# Patient Record
Sex: Female | Born: 1980 | Race: White | Hispanic: No | Marital: Married | Smoking: Never smoker
Health system: Southern US, Community
[De-identification: ages and names within clinical notes are randomized; demographics above are authoritative.]

## PROBLEM LIST (undated history)

## (undated) DIAGNOSIS — R51 Headache: Secondary | ICD-10-CM

## (undated) DIAGNOSIS — E538 Deficiency of other specified B group vitamins: Secondary | ICD-10-CM

## (undated) HISTORY — DX: Headache: R51

## (undated) HISTORY — DX: Deficiency of other specified B group vitamins: E53.8

## (undated) HISTORY — PX: TONSILLECTOMY: SUR1361

## (undated) HISTORY — PX: OTHER SURGICAL HISTORY: SHX169

---

## 2002-10-08 ENCOUNTER — Encounter: Payer: Self-pay | Admitting: Internal Medicine

## 2002-10-08 ENCOUNTER — Encounter: Admission: RE | Admit: 2002-10-08 | Discharge: 2002-10-08 | Payer: Self-pay | Admitting: Internal Medicine

## 2003-02-23 ENCOUNTER — Other Ambulatory Visit: Admission: RE | Admit: 2003-02-23 | Discharge: 2003-02-23 | Payer: Self-pay | Admitting: Obstetrics and Gynecology

## 2004-01-27 ENCOUNTER — Ambulatory Visit (HOSPITAL_COMMUNITY): Admission: RE | Admit: 2004-01-27 | Discharge: 2004-01-27 | Payer: Self-pay | Admitting: Obstetrics and Gynecology

## 2005-10-02 ENCOUNTER — Ambulatory Visit (HOSPITAL_COMMUNITY): Admission: RE | Admit: 2005-10-02 | Discharge: 2005-10-02 | Payer: Self-pay | Admitting: Obstetrics and Gynecology

## 2007-02-28 ENCOUNTER — Encounter: Payer: Self-pay | Admitting: Family Medicine

## 2007-03-06 ENCOUNTER — Ambulatory Visit: Payer: Self-pay | Admitting: Family Medicine

## 2007-03-06 DIAGNOSIS — R51 Headache: Secondary | ICD-10-CM

## 2007-03-07 LAB — CONVERTED CEMR LAB
Albumin: 4.2 g/dL (ref 3.5–5.2)
Basophils Absolute: 0.1 10*3/uL (ref 0.0–0.1)
Bilirubin, Direct: 0.2 mg/dL (ref 0.0–0.3)
Cholesterol: 173 mg/dL (ref 0–200)
Creatinine, Ser: 0.9 mg/dL (ref 0.4–1.2)
Glucose, Bld: 96 mg/dL (ref 70–99)
HCT: 38.7 % (ref 36.0–46.0)
Hemoglobin: 13.1 g/dL (ref 12.0–15.0)
LDL Cholesterol: 88 mg/dL (ref 0–99)
MCHC: 33.9 g/dL (ref 30.0–36.0)
MCV: 87 fL (ref 78.0–100.0)
Monocytes Absolute: 0.7 10*3/uL (ref 0.2–0.7)
Neutrophils Relative %: 67.5 % (ref 43.0–77.0)
Potassium: 4 meq/L (ref 3.5–5.1)
RDW: 10.6 % — ABNORMAL LOW (ref 11.5–14.6)
Sodium: 137 meq/L (ref 135–145)
TSH: 1.55 microintl units/mL (ref 0.35–5.50)
Total Bilirubin: 0.8 mg/dL (ref 0.3–1.2)
Total Protein: 7.6 g/dL (ref 6.0–8.3)
Vitamin B-12: 210 pg/mL — ABNORMAL LOW (ref 211–911)

## 2007-03-11 ENCOUNTER — Ambulatory Visit: Payer: Self-pay | Admitting: Family Medicine

## 2007-03-12 DIAGNOSIS — E538 Deficiency of other specified B group vitamins: Secondary | ICD-10-CM

## 2007-03-19 ENCOUNTER — Ambulatory Visit: Payer: Self-pay | Admitting: Family Medicine

## 2007-03-25 ENCOUNTER — Ambulatory Visit: Payer: Self-pay | Admitting: Family Medicine

## 2007-04-01 ENCOUNTER — Ambulatory Visit: Payer: Self-pay | Admitting: Family Medicine

## 2007-04-08 ENCOUNTER — Ambulatory Visit: Payer: Self-pay | Admitting: Family Medicine

## 2007-04-16 ENCOUNTER — Ambulatory Visit: Payer: Self-pay | Admitting: Family Medicine

## 2007-04-24 ENCOUNTER — Ambulatory Visit: Payer: Self-pay | Admitting: Family Medicine

## 2007-05-06 ENCOUNTER — Ambulatory Visit: Payer: Self-pay | Admitting: Family Medicine

## 2007-05-13 ENCOUNTER — Telehealth: Payer: Self-pay | Admitting: Family Medicine

## 2007-05-13 ENCOUNTER — Ambulatory Visit: Payer: Self-pay | Admitting: Family Medicine

## 2007-05-13 DIAGNOSIS — H00019 Hordeolum externum unspecified eye, unspecified eyelid: Secondary | ICD-10-CM

## 2007-10-06 ENCOUNTER — Ambulatory Visit: Payer: Self-pay | Admitting: Family Medicine

## 2007-10-06 DIAGNOSIS — J309 Allergic rhinitis, unspecified: Secondary | ICD-10-CM | POA: Insufficient documentation

## 2007-10-10 ENCOUNTER — Ambulatory Visit: Payer: Self-pay | Admitting: Family Medicine

## 2007-10-10 DIAGNOSIS — J209 Acute bronchitis, unspecified: Secondary | ICD-10-CM

## 2008-05-07 ENCOUNTER — Ambulatory Visit: Payer: Self-pay | Admitting: Family Medicine

## 2008-05-07 DIAGNOSIS — R5383 Other fatigue: Secondary | ICD-10-CM

## 2008-05-07 DIAGNOSIS — R5381 Other malaise: Secondary | ICD-10-CM

## 2008-05-11 LAB — CONVERTED CEMR LAB
ALT: 9 units/L (ref 0–35)
AST: 21 units/L (ref 0–37)
BUN: 11 mg/dL (ref 6–23)
Basophils Absolute: 0.1 10*3/uL (ref 0.0–0.1)
Bilirubin, Direct: 0 mg/dL (ref 0.0–0.3)
Calcium: 9.3 mg/dL (ref 8.4–10.5)
Creatinine, Ser: 0.8 mg/dL (ref 0.4–1.2)
Eosinophils Relative: 2.6 % (ref 0.0–5.0)
GFR calc non Af Amer: 90.29 mL/min (ref >60)
Lymphocytes Relative: 38.2 % (ref 12.0–46.0)
Monocytes Relative: 7.3 % (ref 3.0–12.0)
Neutrophils Relative %: 50.3 % (ref 43.0–77.0)
Platelets: 197 10*3/uL (ref 150.0–400.0)
Potassium: 4.4 meq/L (ref 3.5–5.1)
RDW: 11.2 % — ABNORMAL LOW (ref 11.5–14.6)
Total Bilirubin: 1 mg/dL (ref 0.3–1.2)
Vitamin B-12: 359 pg/mL (ref 211–911)
WBC: 6.8 10*3/uL (ref 4.5–10.5)

## 2008-05-12 ENCOUNTER — Telehealth: Payer: Self-pay | Admitting: Family Medicine

## 2008-08-27 ENCOUNTER — Telehealth: Payer: Self-pay | Admitting: Family Medicine

## 2008-12-27 ENCOUNTER — Ambulatory Visit: Payer: Self-pay | Admitting: Family Medicine

## 2008-12-27 DIAGNOSIS — J019 Acute sinusitis, unspecified: Secondary | ICD-10-CM

## 2009-08-01 ENCOUNTER — Encounter: Admission: RE | Admit: 2009-08-01 | Discharge: 2009-08-01 | Payer: Self-pay | Admitting: Obstetrics and Gynecology

## 2010-02-06 ENCOUNTER — Telehealth: Payer: Self-pay | Admitting: Family Medicine

## 2010-02-14 ENCOUNTER — Other Ambulatory Visit: Payer: Self-pay | Admitting: Obstetrics and Gynecology

## 2010-02-14 DIAGNOSIS — Z09 Encounter for follow-up examination after completed treatment for conditions other than malignant neoplasm: Secondary | ICD-10-CM

## 2010-02-15 NOTE — Progress Notes (Signed)
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 Pls advise. Also pt needs to know when she had last polio, tdap,mmr, etc? Initial call taken by: Lucy Antigua,  February 06, 2010 1:54 PM  Follow-up for Phone Call        do not do rabies shots here would  call healt dept as far as her inj she would need to contac her school as we do not have any list of her immuz. here.  If she  been to college could get list from them possibly  Follow-up by: Pura Spice, RN,  February 06, 2010 2:02 PM  Additional Follow-up for Phone Call Additional follow up Details #1::        LEft message voice mail. Additional Follow-up by: Lynann Beaver CMA AAMA,  February 06, 2010 2:05 PM

## 2010-02-20 ENCOUNTER — Encounter: Payer: Self-pay | Admitting: Family Medicine

## 2010-02-23 ENCOUNTER — Other Ambulatory Visit: Payer: Self-pay | Admitting: Obstetrics and Gynecology

## 2010-02-23 ENCOUNTER — Ambulatory Visit
Admission: RE | Admit: 2010-02-23 | Discharge: 2010-02-23 | Disposition: A | Discharge status: Home / Self Care | Payer: Self-pay | Source: Ambulatory Visit | Attending: Obstetrics and Gynecology | Admitting: Obstetrics and Gynecology

## 2010-02-23 ENCOUNTER — Ambulatory Visit
Admission: RE | Admit: 2010-02-23 | Discharge: 2010-02-23 | Disposition: A | Discharge status: Home / Self Care | Payer: 59 | Source: Ambulatory Visit | Attending: Obstetrics and Gynecology | Admitting: Obstetrics and Gynecology

## 2010-02-23 DIAGNOSIS — Z09 Encounter for follow-up examination after completed treatment for conditions other than malignant neoplasm: Secondary | ICD-10-CM

## 2010-03-10 ENCOUNTER — Other Ambulatory Visit: Payer: 59 | Admitting: Family Medicine

## 2010-03-10 DIAGNOSIS — Z0000 Encounter for general adult medical examination without abnormal findings: Secondary | ICD-10-CM

## 2010-03-10 LAB — BASIC METABOLIC PANEL
BUN: 15 mg/dL (ref 6–23)
CO2: 26 mEq/L (ref 19–32)
Chloride: 107 mEq/L (ref 96–112)
Creatinine, Ser: 0.8 mg/dL (ref 0.4–1.2)
Potassium: 5.3 mEq/L — ABNORMAL HIGH (ref 3.5–5.1)

## 2010-03-10 LAB — LIPID PANEL
Cholesterol: 136 mg/dL (ref 0–200)
HDL: 71.2 mg/dL (ref >39.00)
Triglycerides: 34 mg/dL (ref 0.0–149.0)

## 2010-03-10 LAB — HEPATIC FUNCTION PANEL
ALT: 18 U/L (ref 0–35)
Albumin: 4.1 g/dL (ref 3.5–5.2)
Bilirubin, Direct: 0.1 mg/dL (ref 0.0–0.3)
Total Protein: 7.4 g/dL (ref 6.0–8.3)

## 2010-03-10 LAB — POCT URINALYSIS DIPSTICK
Bilirubin, UA: NEGATIVE
Glucose, UA: NEGATIVE
Ketones, UA: NEGATIVE
Leukocytes, UA: NEGATIVE
Spec Grav, UA: 1.025
pH, UA: 6.5

## 2010-03-10 LAB — CBC WITH DIFFERENTIAL/PLATELET
Basophils Relative: 0.4 % (ref 0.0–3.0)
Eosinophils Absolute: 0.1 10*3/uL (ref 0.0–0.7)
Eosinophils Relative: 2.2 % (ref 0.0–5.0)
HCT: 34.9 % — ABNORMAL LOW (ref 36.0–46.0)
Lymphs Abs: 1.7 10*3/uL (ref 0.7–4.0)
MCHC: 34.3 g/dL (ref 30.0–36.0)
MCV: 90 fl (ref 78.0–100.0)
Monocytes Absolute: 0.4 10*3/uL (ref 0.1–1.0)
Neutrophils Relative %: 57.8 % (ref 43.0–77.0)
RBC: 3.87 Mil/uL (ref 3.87–5.11)
WBC: 5.4 10*3/uL (ref 4.5–10.5)

## 2010-03-15 ENCOUNTER — Telehealth: Payer: Self-pay

## 2010-03-15 NOTE — Telephone Encounter (Signed)
Message copied by Kyung Rudd on Wed Mar 15, 2010  2:25 PM ------      Message from: Dwaine Deter      Created: Mon Mar 13, 2010  8:30 AM       normal

## 2010-03-20 ENCOUNTER — Encounter: Payer: Self-pay | Admitting: Family Medicine

## 2010-03-20 ENCOUNTER — Ambulatory Visit (INDEPENDENT_AMBULATORY_CARE_PROVIDER_SITE_OTHER): Payer: 59 | Admitting: Family Medicine

## 2010-03-20 VITALS — BP 104/62 | HR 58 | Ht 63.0 in | Wt 129.0 lb

## 2010-03-20 DIAGNOSIS — Z0000 Encounter for general adult medical examination without abnormal findings: Secondary | ICD-10-CM

## 2010-03-20 NOTE — Progress Notes (Signed)
Subjective:    Patient ID: Rebecca Middleton, female    DOB: 1980/03/01, 30 y.o.   MRN: [id number]  HPI 30 yr old female for a cpx. She feels fine and has no complaints. 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    Review of Systems  Constitutional: Negative.  Negative for fever, diaphoresis, activity change, appetite change, fatigue and unexpected weight change.  HENT: Negative.  Negative for hearing loss, ear pain, nosebleeds, congestion, sore throat, trouble swallowing, neck pain, neck stiffness, voice change and tinnitus.   Eyes: Negative.  Negative for photophobia, pain, discharge, redness and visual disturbance.  Respiratory: Negative.  Negative for apnea, cough, choking, chest tightness, shortness of breath, wheezing and stridor.   Cardiovascular: Negative.  Negative for chest pain, palpitations and leg swelling.  Gastrointestinal: Negative.  Negative for nausea, vomiting, abdominal pain, diarrhea, constipation, blood in stool, abdominal distention and rectal pain.  Genitourinary: Negative.  Negative for dysuria, urgency, frequency, hematuria, flank pain, scrotal swelling, vaginal bleeding, vaginal discharge, enuresis, difficulty urinating, testicular pain, vaginal pain and menstrual problem.  Musculoskeletal: Negative.  Negative for myalgias, back pain, joint swelling, arthralgias and gait problem.  Skin: Negative.  Negative for color change, pallor, rash and wound.  Neurological: Negative.  Negative for dizziness, tremors, seizures, syncope, speech difficulty, weakness, light-headedness, numbness and headaches.  Hematological: Negative.  Negative for adenopathy. Does not bruise/bleed easily.  Psychiatric/Behavioral: Negative.  Negative for hallucinations, behavioral problems, confusion, sleep disturbance, dysphoric mood and agitation. The patient is not nervous/anxious.        Objective:   Physical Exam  Constitutional: She appears  well-developed and well-nourished. No distress.  HENT:  Head: Normocephalic and atraumatic.  Right Ear: External ear normal.  Left Ear: External ear normal.  Nose: Nose normal.  Mouth/Throat: Oropharynx is clear and moist. No oropharyngeal exudate.  Eyes: Conjunctivae and EOM are normal. Pupils are equal, round, and reactive to light. Right eye exhibits no discharge. Left eye exhibits no discharge. No scleral icterus.  Neck: Normal range of motion. Neck supple. No JVD present. No thyromegaly present.  Cardiovascular: Normal rate, regular rhythm, normal heart sounds and intact distal pulses.  Exam reveals no gallop and no friction rub.   No murmur heard. Pulmonary/Chest: Effort normal and breath sounds normal. No stridor. No respiratory distress. She has no wheezes. She has no rales. She exhibits no tenderness.  Abdominal: Soft. Normal appearance and bowel sounds are normal. She exhibits no distension, no abdominal bruit, no ascites and no mass. There is no hepatosplenomegaly. There is no tenderness. There is no rigidity, no rebound and no guarding. No hernia.  Genitourinary: Rectum normal, vagina normal and uterus normal. No breast swelling, tenderness, discharge or bleeding. Cervix exhibits no motion tenderness, no discharge and no friability. Right adnexum displays no mass, no tenderness and no fullness. Left adnexum displays no mass, no tenderness and no fullness. No erythema, tenderness or bleeding around the vagina. No vaginal discharge found.  Musculoskeletal: Normal range of motion. She exhibits no edema and no tenderness.  Lymphadenopathy:    She has no cervical adenopathy.  Neurological: She is alert. She has normal reflexes. No cranial nerve deficit. She exhibits normal muscle tone. Coordination normal.  Skin: Skin is warm and dry. No rash noted. She is not diaphoretic. No erythema. No pallor.  Psychiatric: She has a normal mood and affect. Her behavior is normal. Judgment and thought  content normal.  Assessment & Plan:  Doing well. Forms were filled out, although she needs to get her immunization information to [place].

## 2010-05-26 NOTE — Op Note (Signed)
NAMEOTHELIA, RIEDERER                ACCOUNT NO.:  [id number]   MEDICAL RECORD NO.:  [id number]          PATIENT TYPE:  AMB   LOCATION:  [facility]C                           FACILITY:  [facility]   PHYSICIAN:  Michelle L. Grewal, M.D.DATE OF BIRTH:  1980/01/12   DATE OF PROCEDURE:  10/02/2005  DATE OF DISCHARGE:  10/02/2005                                 OPERATIVE REPORT   PREOPERATIVE DIAGNOSIS:  Secondary infertility and pelvic pain.   POSTOPERATIVE DIAGNOSIS:  Secondary infertility, bilateral patent fallopian  tubes, endometriosis involving the right ovary, bladder flap, left ovary,  and the cul-de-sac.   PROCEDURE:  Diagnostic laparoscopy, fulguration of endometriosis, removal of  right ovarian endometrioma, and chromotubation.   SURGEON:  Michelle L. Vincente Poli, M.D.   ANESTHESIA:  General.   SPECIMENS:  None.   ESTIMATED BLOOD LOSS:  Minimal.   COMPLICATIONS:  None.   PATHOLOGY:  None.   DRAINS:  None.   PROCEDURE:  The patient went to the operating room and was intubated without  difficulty after informed consent was obtained.  She was then prepped and  draped in the usual sterile fashion.  In-and-out catheter was used to empty  the bladder.  A speculum was inserted and the uterine manipulator was  inserted without difficulty.  Attention was turned to the abdomen.  A small  infraumbilical incision was made, the Veress needle was inserted, and  pneumoperitoneum was performed.  The 11 mm trocar was inserted. The  laparoscope was inserted through the trocar sheath.  The patient was gently  placed in Trendelenburg position.  A secondary trocar was inserted  suprapubically under direct visualization.  This was a 5 mm trocar. Exam of  the abdomen and pelvis revealed the following:  The uterus, itself, appeared  very normal with a normal contour, was mid position, normal size, no  fibroids were seen.  Her right ovary was markedly enlarged and stuck to the  right sidewall and when I tried  to elevate it with a blunt prepped, I  noticed a chocolate appearing fluid emanating from the posterior portion of  the ovary.  This was consistent with an endometrioma within the ovary.  The  left ovary also, likewise, had areas of chocolate appearing cyst but was  much smaller than the right ovary and was a little bit more mobile than the  right. There were small scattered powder burn lesions on the bladder flap,  as well.  I used the cautery and actually burned most of the lesions on the  bladder flap.  We elevated the ovaries and freed them from the sidewalls and  then burned all of the chocolate appearing areas on the left ovary as well  as drained the endometrioma within the right ovary and burned the base of  it.  At the and of the procedure, I then performed chromotubation and both  tubes filled and spilled without difficulty.  The patient's endometriosis  greatly reduced at the end of the procedure, however, because of her  secondary fertility, I do think she would benefit from Depo-Lupron postop.  This will be  discussed with her husband after I complete the procedure.  At  the end of the procedure, the pelvis was irrigated, hemostasis was  excellent.  Her ovaries were completely mobile at the end of the procedure.  I then released the pneumoperitoneum, removed the trocars, and placed  Dermabond skin adhesive at each incision site.  All instruments were removed  from the vagina. All sponge, lap and instrument counts were correct x2.  The  patient went to the recovery room in stable condition.      Michelle L. Vincente Poli, M.D.  Electronically Signed     MLG/MEDQ  D:  10/18/2005  T:  10/19/2005  Job:  [id number]

## 2010-08-16 ENCOUNTER — Ambulatory Visit (INDEPENDENT_AMBULATORY_CARE_PROVIDER_SITE_OTHER): Payer: 59 | Admitting: Family Medicine

## 2010-08-16 DIAGNOSIS — Z0000 Encounter for general adult medical examination without abnormal findings: Secondary | ICD-10-CM

## 2010-08-16 DIAGNOSIS — Z23 Encounter for immunization: Secondary | ICD-10-CM

## 2010-08-23 ENCOUNTER — Encounter: Payer: Self-pay | Admitting: Family Medicine

## 2010-08-23 ENCOUNTER — Ambulatory Visit (INDEPENDENT_AMBULATORY_CARE_PROVIDER_SITE_OTHER): Payer: 59 | Admitting: Family Medicine

## 2010-08-23 VITALS — BP 108/74 | HR 78 | Temp 98.4°F | Wt 126.0 lb

## 2010-08-23 DIAGNOSIS — R350 Frequency of micturition: Secondary | ICD-10-CM

## 2010-08-23 DIAGNOSIS — N390 Urinary tract infection, site not specified: Secondary | ICD-10-CM

## 2010-08-23 LAB — POCT URINALYSIS DIPSTICK
Bilirubin, UA: NEGATIVE
Glucose, UA: NEGATIVE
Ketones, UA: NEGATIVE
Protein, UA: NEGATIVE
Spec Grav, UA: 1.015

## 2010-08-23 MED ORDER — CIPROFLOXACIN HCL 500 MG PO TABS: 500.0000 mg | ORAL_TABLET | Freq: Two times a day (BID) | ORAL | Status: AC

## 2010-08-23 NOTE — Progress Notes (Signed)
  Subjective:    Patient ID: Rebecca Middleton, female    DOB: [birth year], 30 y.o.   MRN: [id number]  HPI Here for 3 days of urinary pressure and burning. No fever. Drinking water and taking Azo.   Review of Systems  Constitutional: Negative.   Gastrointestinal: Negative.   Genitourinary: Positive for dysuria, urgency and frequency. Negative for flank pain.       Objective:   Physical Exam  Constitutional: She appears well-developed and well-nourished.  Abdominal: Soft. Bowel sounds are normal. She exhibits no distension and no mass. There is no tenderness. There is no rebound and no guarding.          Assessment & Plan:  Recheck prn

## 2012-08-01 ENCOUNTER — Ambulatory Visit (INDEPENDENT_AMBULATORY_CARE_PROVIDER_SITE_OTHER): Payer: 59 | Admitting: Family Medicine

## 2012-08-01 ENCOUNTER — Encounter: Payer: Self-pay | Admitting: Family Medicine

## 2012-08-01 VITALS — BP 100/60 | HR 83 | Temp 98.2°F | Wt 126.0 lb

## 2012-08-01 DIAGNOSIS — R1013 Epigastric pain: Secondary | ICD-10-CM

## 2012-08-01 NOTE — Progress Notes (Signed)
  Subjective:    Patient ID: Rebecca Middleton, female    DOB: 14-Oct-1980, 32 y.o.   MRN: [id number]  HPI Here for one week of epigastric pains. 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 She took this yesterday and passed it. The pain is intermittent and dull in character. She has vomited twice but this contained only food. No blood seen. Her BMs are normal. No fever.    Review of Systems  Constitutional: Negative.   Respiratory: Negative.   Cardiovascular: Negative.   Gastrointestinal: Positive for nausea, vomiting and abdominal pain. Negative for diarrhea, constipation, blood in stool and abdominal distention.  Endocrine: Negative.        Objective:   Physical Exam  Constitutional: She appears well-developed and well-nourished.  Abdominal: Soft. Bowel sounds are normal. She exhibits no distension and no mass. There is no rebound and no guarding.  Mildly tender in the epigastrium           Assessment & Plan:  Probably duodenitis. Try Prilosec OTC for a week. Recheck prn

## 2012-09-04 ENCOUNTER — Encounter: Payer: 59 | Admitting: Family Medicine

## 2012-09-18 ENCOUNTER — Ambulatory Visit (INDEPENDENT_AMBULATORY_CARE_PROVIDER_SITE_OTHER): Payer: 59 | Admitting: Family Medicine

## 2012-09-18 ENCOUNTER — Encounter: Payer: Self-pay | Admitting: Family Medicine

## 2012-09-18 VITALS — BP 102/64 | HR 84 | Temp 98.0°F | Ht 63.0 in | Wt 129.0 lb

## 2012-09-18 DIAGNOSIS — IMO0001 Reserved for inherently not codable concepts without codable children: Secondary | ICD-10-CM

## 2012-09-18 DIAGNOSIS — S61451A Open bite of right hand, initial encounter: Secondary | ICD-10-CM

## 2012-09-18 DIAGNOSIS — S61409A Unspecified open wound of unspecified hand, initial encounter: Secondary | ICD-10-CM

## 2012-09-18 MED ORDER — AMOXICILLIN-POT CLAVULANATE 875-125 MG PO TABS: 1.0000 | ORAL_TABLET | Freq: Two times a day (BID) | ORAL | Status: DC

## 2012-09-18 NOTE — Patient Instructions (Addendum)

## 2012-09-18 NOTE — Progress Notes (Signed)
  Subjective:    Patient ID: Rebecca Middleton, female    DOB: Nov 07, 1980, 32 y.o.   MRN: [id number]  HPI Patient seen with cat bite right hand. She works at a [employer], product/process development and was helping to hold the cat when she lost her grip. She has a couple of puncture marks right hand dorsally. This cat has been fully vaccinated previously Injury occurred couple hours ago. She is already seeing some mild swelling and soreness. No fevers or chills.  Patient's last tetanus was in 2012. She has received two out of three rabies vaccines herself. Again, cat has been fully vaccinated for rabies and has not had any recent illness or strange behaviors. Patient has no allergy to penicillin  Past Medical History  Diagnosis Date  . Headache(784.0)   . Endometriosis   . Vitamin B12 deficiency    Past Surgical History  Procedure Laterality Date  . Tonsillectomy    . Laproscopy      9-07 Dr Vincente Poli    reports that she has never smoked. She has never used smokeless tobacco. She reports that she drinks about 1.5 ounces of alcohol per week. She reports that she does not use illicit drugs. family history includes Alcohol abuse in an other family member. No Known Allergies    Review of Systems  Constitutional: Negative for fever and chills.       Objective:   Physical Exam  Constitutional: She appears well-developed and well-nourished.  Cardiovascular: Normal rate and regular rhythm.   Pulmonary/Chest: Effort normal and breath sounds normal. No respiratory distress. She has no wheezes. She has no rales.  Skin:  Right hand reveals deeper puncture wound dorsally over the MCP joint middle finger. She has 3 smaller puncture wounds distal hand over the third metacarpal. Mild surrounding edema. Very minimal erythema. Full range of motion all digits of the hand.          Assessment & Plan:  Cat bite involving right hand. High risk features include the fact was a cat, involvement of hand, involvement  near joint (3rd MCP), and fact this was a fairly deep puncture wound. Start Augmentin 875 mg twice daily for 10 days. Continue warm compresses. Followup promptly for any worsening symptoms such as increased redness, fever, or any progressive swelling

## 2012-10-09 ENCOUNTER — Encounter: Payer: Self-pay | Admitting: Family Medicine

## 2012-10-09 ENCOUNTER — Ambulatory Visit (INDEPENDENT_AMBULATORY_CARE_PROVIDER_SITE_OTHER): Payer: 59 | Admitting: Family Medicine

## 2012-10-09 VITALS — BP 102/66 | HR 90 | Temp 98.5°F | Ht 63.5 in | Wt 125.0 lb

## 2012-10-09 DIAGNOSIS — Z0000 Encounter for general adult medical examination without abnormal findings: Secondary | ICD-10-CM

## 2012-10-09 LAB — CBC WITH DIFFERENTIAL/PLATELET
Basophils Absolute: 0 10*3/uL (ref 0.0–0.1)
Lymphocytes Relative: 22.9 % (ref 12.0–46.0)
Monocytes Relative: 7.2 % (ref 3.0–12.0)
Platelets: 289 10*3/uL (ref 150.0–400.0)
RDW: 12.4 % (ref 11.5–14.6)
WBC: 8.1 10*3/uL (ref 4.5–10.5)

## 2012-10-09 NOTE — Progress Notes (Signed)
  Subjective:    Patient ID: Rebecca Middleton, female    DOB: 20-Mar-1980, 32 y.o.   MRN: [id number]  HPI 32 yr old female for a cpx. She has no concerns today. She is recovering from a cat bite on the right hand. She took Augmentin and [facility] She is also treating a spot of ringworm om the left wrist with OTC Tinactin cream.  This is fading away. 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    Review of Systems  Constitutional: Negative.   HENT: Negative.   Eyes: Negative.   Respiratory: Negative.   Cardiovascular: Negative.   Gastrointestinal: Negative.   Genitourinary: Negative for dysuria, urgency, frequency, hematuria, flank pain, decreased urine volume, enuresis, difficulty urinating, pelvic pain and dyspareunia.  Musculoskeletal: Negative.   Skin: Negative.   Neurological: Negative.   Psychiatric/Behavioral: Negative.        Objective:   Physical Exam  Constitutional: She is oriented to person, place, and time. She appears well-developed and well-nourished. No distress.  HENT:  Head: Normocephalic and atraumatic.  Right Ear: External ear normal.  Left Ear: External ear normal.  Nose: Nose normal.  Mouth/Throat: Oropharynx is clear and moist. No oropharyngeal exudate.  Eyes: Conjunctivae and EOM are normal. Pupils are equal, round, and reactive to light. No scleral icterus.  Neck: Normal range of motion. Neck supple. No JVD present. No thyromegaly present.  Cardiovascular: Normal rate, regular rhythm, normal heart sounds and intact distal pulses.  Exam reveals no gallop and no friction rub.   No murmur heard. Pulmonary/Chest: Effort normal and breath sounds normal. No respiratory distress. She has no wheezes. She has no rales. She exhibits no tenderness.  Abdominal: Soft. Bowel sounds are normal. She exhibits no distension and no mass. There is no tenderness. There is no rebound and no  guarding.  Musculoskeletal: Normal range of motion. She exhibits no edema and no tenderness.  Lymphadenopathy:    She has no cervical adenopathy.  Neurological: She is alert and oriented to person, place, and time. She has normal reflexes. No cranial nerve deficit. She exhibits normal muscle tone. Coordination normal.  Skin: Skin is warm and dry. No rash noted. No erythema.  Psychiatric: She has a normal mood and affect. Her behavior is normal. Judgment and thought content normal.          Assessment & Plan:  Well exam. Get fasting labs

## 2012-10-10 LAB — TSH: TSH: 1.15 u[IU]/mL (ref 0.35–5.50)

## 2012-10-10 LAB — BASIC METABOLIC PANEL
BUN: 10 mg/dL (ref 6–23)
Creatinine, Ser: 0.8 mg/dL (ref 0.4–1.2)
GFR: 84.06 mL/min (ref >60.00)
Glucose, Bld: 71 mg/dL (ref 70–99)
Potassium: 4.4 mEq/L (ref 3.5–5.1)

## 2012-10-10 LAB — HEPATIC FUNCTION PANEL
ALT: 13 U/L (ref 0–35)
AST: 17 U/L (ref 0–37)
Albumin: 4.4 g/dL (ref 3.5–5.2)

## 2012-10-10 LAB — LIPID PANEL
Cholesterol: 139 mg/dL (ref 0–200)
Triglycerides: 32 mg/dL (ref 0.0–149.0)
VLDL: 6.4 mg/dL (ref 0.0–40.0)

## 2012-10-13 NOTE — Progress Notes (Signed)
Quick Note:  Releases results in my chart. ______

## 2012-12-30 ENCOUNTER — Ambulatory Visit (INDEPENDENT_AMBULATORY_CARE_PROVIDER_SITE_OTHER): Payer: 59 | Admitting: Family

## 2012-12-30 ENCOUNTER — Encounter: Payer: Self-pay | Admitting: Family

## 2012-12-30 VITALS — BP 108/62 | HR 112 | Temp 99.1°F | Wt 127.0 lb

## 2012-12-30 DIAGNOSIS — R6889 Other general symptoms and signs: Secondary | ICD-10-CM

## 2012-12-30 DIAGNOSIS — J1189 Influenza due to unidentified influenza virus with other manifestations: Secondary | ICD-10-CM

## 2012-12-30 DIAGNOSIS — IMO0001 Reserved for inherently not codable concepts without codable children: Secondary | ICD-10-CM

## 2012-12-30 LAB — POCT INFLUENZA A/B
Influenza A, POC: POSITIVE
Influenza B, POC: POSITIVE

## 2012-12-30 MED ORDER — HYDROCODONE-HOMATROPINE 5-1.5 MG/5ML PO SYRP: 5.0000 mL | ORAL_SOLUTION | Freq: Three times a day (TID) | ORAL | Status: DC | PRN

## 2012-12-30 NOTE — Progress Notes (Signed)
   Subjective:    Patient ID: Rebecca Middleton, female    DOB: 1980/08/28, 32 y.o.   MRN: [id number]  HPI  32 year old nonsmoker, patient of Dr. Clent Ridges then sudden onset of fever, cough, congestion, diarrhea, and chest tightness x2 days. She's been taking over-the-counter medication without relief. Did not get the influenza vaccine this year.  Review of Systems  Constitutional: Positive for fever and fatigue.  HENT: Positive for congestion and ear pain. Negative for sore throat.   Respiratory: Positive for cough. Negative for shortness of breath and wheezing.   Cardiovascular: Negative.   Gastrointestinal: Positive for diarrhea.  Musculoskeletal: Positive for myalgias.  Skin: Negative.   Neurological: Positive for headaches.  Psychiatric/Behavioral: Negative.    Past Medical History  Diagnosis Date  . Headache(784.0)   . Endometriosis   . Vitamin B12 deficiency     History   Social History  . Marital Status: Married    Spouse Name: N/A    Number of Children: N/A  . Years of Education: N/A   Occupational History  . Not on file.   Social History Main Topics  . Smoking status: Never Smoker   . Smokeless tobacco: Never Used  . Alcohol Use: 1.5 oz/week    3 drink(s) per week  . Drug Use: No  . Sexual Activity: Not on file   Other Topics Concern  . Not on file   Social History Narrative  . No narrative on file    Past Surgical History  Procedure Laterality Date  . Tonsillectomy    . Laproscopy      9-07 Dr Vincente Poli    Family History  Problem Relation Age of Onset  . Alcohol abuse      family hx    No Known Allergies  No current outpatient prescriptions on file prior to visit.   No current facility-administered medications on file prior to visit.    BP 108/62  Pulse 112  Temp(Src) 99.1 F (37.3 C) (Oral)  Wt 127 lb (57.607 kg)chart    Objective:   Physical Exam  Constitutional: She is oriented to person, place, and time. She appears well-developed and  well-nourished.  Appears ill  HENT:  Right Ear: External ear normal.  Left Ear: External ear normal.  Nose: Nose normal.  Mouth/Throat: Oropharynx is clear and moist.  Neck: Normal range of motion. Neck supple.  Cardiovascular: Normal rate, regular rhythm and normal heart sounds.   Pulmonary/Chest: Effort normal and breath sounds normal.  Musculoskeletal: Normal range of motion.  Neurological: She is alert and oriented to person, place, and time.  Skin: Skin is warm and dry.  Psychiatric: She has a normal mood and affect.          Assessment & Plan:  Assessment: 1. Influenza 2. Myalgias  Plan: Hycodan syrup as needed for cough. Warned of drowsiness. Tylenol and ibuprofen as needed for fever aches and pains. Call the office with any questions or concerns. Recheck as scheduled and as needed.

## 2012-12-30 NOTE — Patient Instructions (Signed)

## 2013-02-13 ENCOUNTER — Ambulatory Visit (INDEPENDENT_AMBULATORY_CARE_PROVIDER_SITE_OTHER): Payer: 59 | Admitting: Family Medicine

## 2013-02-13 ENCOUNTER — Encounter: Payer: Self-pay | Admitting: Family Medicine

## 2013-02-13 VITALS — BP 100/72 | HR 81 | Wt 127.0 lb

## 2013-02-13 DIAGNOSIS — A499 Bacterial infection, unspecified: Secondary | ICD-10-CM

## 2013-02-13 DIAGNOSIS — B9689 Other specified bacterial agents as the cause of diseases classified elsewhere: Secondary | ICD-10-CM

## 2013-02-13 DIAGNOSIS — H1089 Other conjunctivitis: Secondary | ICD-10-CM

## 2013-02-13 DIAGNOSIS — H109 Unspecified conjunctivitis: Secondary | ICD-10-CM

## 2013-02-13 MED ORDER — POLYMYXIN B-TRIMETHOPRIM 10000-0.1 UNIT/ML-% OP SOLN: 2.0000 [drp] | OPHTHALMIC | Status: DC

## 2013-02-13 NOTE — Progress Notes (Signed)
   Subjective:    Patient ID: Rebecca Middleton, female    DOB: [birth year], 33 y.o.   MRN: [id number]  Eye Problem  Associated symptoms include an eye discharge and eye redness. Pertinent negatives include no fever or photophobia.   Patient seen with left eye irritation. 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 As the day progressed she noticed some thick yellow to green purulent secretions and increased redness of the left eye. She has not had any eye pain or any blurred vision. No corneal clouding. No right eye symptoms. She's had some thick yellow-green drainage throughout the day.  Past Medical History  Diagnosis Date  . Headache(784.0)   . Endometriosis   . Vitamin B12 deficiency    Past Surgical History  Procedure Laterality Date  . Tonsillectomy    . Laproscopy      9-07 Dr Helane Rima    reports that she has never smoked. She has never used smokeless tobacco. She reports that she drinks about 1.5 ounces of alcohol per week. She reports that she does not use illicit drugs. family history includes Alcohol abuse in an other family member. No Known Allergies    Review of Systems  Constitutional: Negative for fever and chills.  Eyes: Positive for discharge, redness and itching. Negative for photophobia, pain and visual disturbance.       Objective:   Physical Exam  Constitutional: She appears well-developed and well-nourished.  Eyes: Pupils are equal, round, and reactive to light.  Left conjunctiva is erythematous. She has some thick yellow-green purulent secretions on the lower lid. Cornea appears normal. Pupils equal round reactive to light  Cardiovascular: Normal rate.   Pulmonary/Chest: Effort normal and breath sounds normal. No respiratory distress. She has no wheezes. She has no rales.          Assessment & Plan:  Bacterial conjunctivitis left eye. Warm compresses several times  daily. Polytrim ophthalmic drops every 2 hours while awake tonight then every 4 hours while awake. Followup promptly if symptoms not improving over the next couple days and sooner if she develops any visual changes or eye pain.

## 2013-02-13 NOTE — Patient Instructions (Signed)

## 2013-02-13 NOTE — Progress Notes (Signed)
Pre visit review using our clinic review tool, if applicable. No additional management support is needed unless otherwise documented below in the visit note. 

## 2013-10-23 ENCOUNTER — Other Ambulatory Visit: Payer: Self-pay

## 2014-04-22 ENCOUNTER — Other Ambulatory Visit: Payer: Self-pay

## 2014-04-29 ENCOUNTER — Encounter: Payer: Self-pay | Admitting: Family Medicine

## 2014-04-29 ENCOUNTER — Ambulatory Visit (INDEPENDENT_AMBULATORY_CARE_PROVIDER_SITE_OTHER): Payer: 59 | Admitting: Family Medicine

## 2014-04-29 VITALS — BP 95/64 | HR 70 | Temp 99.0°F | Ht 63.5 in | Wt 128.0 lb

## 2014-04-29 DIAGNOSIS — Z0000 Encounter for general adult medical examination without abnormal findings: Secondary | ICD-10-CM

## 2014-04-29 DIAGNOSIS — E538 Deficiency of other specified B group vitamins: Secondary | ICD-10-CM | POA: Diagnosis not present

## 2014-04-29 DIAGNOSIS — R0602 Shortness of breath: Secondary | ICD-10-CM

## 2014-04-29 LAB — POCT URINALYSIS DIPSTICK
Bilirubin, UA: NEGATIVE
Glucose, UA: NEGATIVE
Ketones, UA: NEGATIVE
Leukocytes, UA: NEGATIVE
Nitrite, UA: NEGATIVE
PROTEIN UA: NEGATIVE
RBC UA: NEGATIVE
SPEC GRAV UA: 1.015
UROBILINOGEN UA: 0.2
pH, UA: 8.5

## 2014-04-29 LAB — CBC WITH DIFFERENTIAL/PLATELET
BASOS ABS: 0 10*3/uL (ref 0.0–0.1)
Basophils Relative: 0.5 % (ref 0.0–3.0)
Eosinophils Absolute: 0.1 10*3/uL (ref 0.0–0.7)
Eosinophils Relative: 1.5 % (ref 0.0–5.0)
HCT: 36.9 % (ref 36.0–46.0)
Hemoglobin: 12.7 g/dL (ref 12.0–15.0)
LYMPHS ABS: 2.1 10*3/uL (ref 0.7–4.0)
Lymphocytes Relative: 30.4 % (ref 12.0–46.0)
MCHC: 34.4 g/dL (ref 30.0–36.0)
MCV: 88.7 fl (ref 78.0–100.0)
MONO ABS: 0.5 10*3/uL (ref 0.1–1.0)
MONOS PCT: 6.9 % (ref 3.0–12.0)
Neutro Abs: 4.1 10*3/uL (ref 1.4–7.7)
Neutrophils Relative %: 60.7 % (ref 43.0–77.0)
PLATELETS: 332 10*3/uL (ref 150.0–400.0)
RBC: 4.16 Mil/uL (ref 3.87–5.11)
RDW: 12.1 % (ref 11.5–15.5)
WBC: 6.8 10*3/uL (ref 4.0–10.5)

## 2014-04-29 LAB — VITAMIN B12: Vitamin B-12: 267 pg/mL (ref 211–911)

## 2014-04-29 LAB — BASIC METABOLIC PANEL
BUN: 12 mg/dL (ref 6–23)
CALCIUM: 9.9 mg/dL (ref 8.4–10.5)
CO2: 26 mEq/L (ref 19–32)
Chloride: 104 mEq/L (ref 96–112)
Creatinine, Ser: 0.84 mg/dL (ref 0.40–1.20)
GFR: 82.14 mL/min (ref >60.00)
GLUCOSE: 82 mg/dL (ref 70–99)
POTASSIUM: 4.6 meq/L (ref 3.5–5.1)
SODIUM: 137 meq/L (ref 135–145)

## 2014-04-29 LAB — HEPATIC FUNCTION PANEL
ALBUMIN: 4.5 g/dL (ref 3.5–5.2)
ALT: 11 U/L (ref 0–35)
AST: 16 U/L (ref 0–37)
Alkaline Phosphatase: 42 U/L (ref 39–117)
Bilirubin, Direct: 0.2 mg/dL (ref 0.0–0.3)
Total Bilirubin: 0.8 mg/dL (ref 0.2–1.2)
Total Protein: 7.9 g/dL (ref 6.0–8.3)

## 2014-04-29 LAB — LIPID PANEL
CHOL/HDL RATIO: 2
Cholesterol: 134 mg/dL (ref 0–200)
HDL: 77.1 mg/dL (ref >39.00)
LDL CALC: 48 mg/dL (ref 0–99)
NonHDL: 56.9
TRIGLYCERIDES: 46 mg/dL (ref 0.0–149.0)
VLDL: 9.2 mg/dL (ref 0.0–40.0)

## 2014-04-29 LAB — TSH: TSH: 1.46 u[IU]/mL (ref 0.35–4.50)

## 2014-04-29 MED ORDER — ALBUTEROL SULFATE HFA 108 (90 BASE) MCG/ACT IN AERS: 2.0000 | INHALATION_SPRAY | RESPIRATORY_TRACT | Status: DC | PRN

## 2014-04-29 NOTE — Progress Notes (Signed)
   Subjective:    Patient ID: Rebecca Middleton, female    DOB: Oct 31, 1980, 34 y.o.   MRN: [id number]  HPI 34 yr old female for a cpx. She has one problem to discuss. She sometimes feels very SOB on exertion, such as when she runs. She has started running a few miles at a time, and usually this turns out well. However on hard runs (such as during a 5K race) or when she runs in cold weather, she often gets very SOB with some wheezing and coughing. No chest pains. She never had asthma as a child but she was exposed to a lot of second hand smoke as a child.    Review of Systems  Constitutional: Negative.   HENT: Negative.   Eyes: Negative.   Respiratory: Positive for cough, chest tightness, shortness of breath and wheezing.   Cardiovascular: Negative.   Gastrointestinal: Negative.   Genitourinary: Negative for dysuria, urgency, frequency, hematuria, flank pain, decreased urine volume, enuresis, difficulty urinating, pelvic pain and dyspareunia.  Musculoskeletal: Negative.   Skin: Negative.   Neurological: Negative.   Psychiatric/Behavioral: Negative.        Objective:   Physical Exam  Constitutional: She is oriented to person, place, and time. She appears well-developed and well-nourished. No distress.  HENT:  Head: Normocephalic and atraumatic.  Right Ear: External ear normal.  Left Ear: External ear normal.  Nose: Nose normal.  Mouth/Throat: Oropharynx is clear and moist. No oropharyngeal exudate.  Eyes: Conjunctivae and EOM are normal. Pupils are equal, round, and reactive to light. No scleral icterus.  Neck: Normal range of motion. Neck supple. No JVD present. No thyromegaly present.  Cardiovascular: Normal rate, regular rhythm, normal heart sounds and intact distal pulses.  Exam reveals no gallop and no friction rub.   No murmur heard. Pulmonary/Chest: Effort normal and breath sounds normal. No respiratory distress. She has no wheezes. She has no rales. She exhibits no tenderness.    Abdominal: Soft. Bowel sounds are normal. She exhibits no distension and no mass. There is no tenderness. There is no rebound and no guarding.  Musculoskeletal: Normal range of motion. She exhibits no edema or tenderness.  Lymphadenopathy:    She has no cervical adenopathy.  Neurological: She is alert and oriented to person, place, and time. She has normal reflexes. No cranial nerve deficit. She exhibits normal muscle tone. Coordination normal.  Skin: Skin is warm and dry. No rash noted. No erythema.  Psychiatric: She has a normal mood and affect. Her behavior is normal. Judgment and thought content normal.          Assessment & Plan:  Well exam. Get fasting labs today. 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 Try a Ventolin HFA inhaler before working out or before running. Recheck prn

## 2014-04-29 NOTE — Progress Notes (Signed)
Pre visit review using our clinic review tool, if applicable. No additional management support is needed unless otherwise documented below in the visit note. 

## 2015-01-11 ENCOUNTER — Encounter: Payer: Self-pay | Admitting: Family Medicine

## 2015-01-11 ENCOUNTER — Ambulatory Visit (INDEPENDENT_AMBULATORY_CARE_PROVIDER_SITE_OTHER): Payer: Commercial Managed Care - HMO | Admitting: Family Medicine

## 2015-01-11 VITALS — BP 106/74 | Temp 98.3°F | Ht 63.5 in | Wt 128.7 lb

## 2015-01-11 DIAGNOSIS — R1013 Epigastric pain: Secondary | ICD-10-CM

## 2015-01-11 NOTE — Progress Notes (Signed)
   Subjective:    Patient ID: Rebecca Middleton, female    DOB: 18-Feb-1980, 35 y.o.   MRN: [id number]  HPI Here for intermittent epigastric pains. She had one episode of this 2 weeks ago after a dinner of prime rib beef. This lasted several hours and went away. Then it started again yesterday afternoon after a lunch of a burger and french fries. The pain was more severe yesterday evening and she had nausea with it and even vomited once. The nausea resolved and the pain improved, but she still has some pain today. Her appetite is normal. No fever. The pain radiates to the center of her back at times. She has taken a lot of TUMS with no effect on the pain. No urinary symptoms. Her BMs have been normal, with the last one coming last night.    Review of Systems  Constitutional: Negative.   Respiratory: Negative.   Cardiovascular: Negative.   Gastrointestinal: Positive for nausea, vomiting and abdominal pain. Negative for diarrhea, constipation, blood in stool, abdominal distention, anal bleeding and rectal pain.  Genitourinary: Negative.        Objective:   Physical Exam  Constitutional: She appears well-developed and well-nourished. No distress.  Neck: No thyromegaly present.  Cardiovascular: Normal rate, regular rhythm, normal heart sounds and intact distal pulses.   Pulmonary/Chest: Effort normal and breath sounds normal.  Abdominal: Soft. Bowel sounds are normal. She exhibits no distension and no mass. There is no rebound and no guarding.  Tender in the epigastrium and RUQ   Lymphadenopathy:    She has no cervical adenopathy.          Assessment & Plan:  Epigastric pain, likely from a gall bladder source. In case it is duodenitis, she will start taking Omeprazole 20 mg daily. Set up an [place] soon.

## 2015-01-20 ENCOUNTER — Ambulatory Visit
Admission: RE | Admit: 2015-01-20 | Discharge: 2015-01-20 | Disposition: A | Discharge status: Home / Self Care | Payer: Commercial Managed Care - HMO | Source: Ambulatory Visit | Attending: Family Medicine | Admitting: Family Medicine

## 2015-01-20 DIAGNOSIS — R1013 Epigastric pain: Secondary | ICD-10-CM

## 2015-05-05 ENCOUNTER — Encounter: Payer: Commercial Managed Care - HMO | Admitting: Family Medicine

## 2015-05-05 ENCOUNTER — Other Ambulatory Visit (INDEPENDENT_AMBULATORY_CARE_PROVIDER_SITE_OTHER): Payer: Commercial Managed Care - HMO

## 2015-05-05 DIAGNOSIS — Z0000 Encounter for general adult medical examination without abnormal findings: Secondary | ICD-10-CM

## 2015-05-05 LAB — CBC WITH DIFFERENTIAL/PLATELET
BASOS PCT: 0.5 % (ref 0.0–3.0)
Basophils Absolute: 0 10*3/uL (ref 0.0–0.1)
Eosinophils Absolute: 0.1 10*3/uL (ref 0.0–0.7)
Eosinophils Relative: 1 % (ref 0.0–5.0)
HEMATOCRIT: 37.5 % (ref 36.0–46.0)
Hemoglobin: 12.8 g/dL (ref 12.0–15.0)
LYMPHS PCT: 28.5 % (ref 12.0–46.0)
Lymphs Abs: 1.7 10*3/uL (ref 0.7–4.0)
MCHC: 34.2 g/dL (ref 30.0–36.0)
MCV: 89.2 fl (ref 78.0–100.0)
MONOS PCT: 8.5 % (ref 3.0–12.0)
Monocytes Absolute: 0.5 10*3/uL (ref 0.1–1.0)
NEUTROS ABS: 3.7 10*3/uL (ref 1.4–7.7)
Neutrophils Relative %: 61.5 % (ref 43.0–77.0)
PLATELETS: 290 10*3/uL (ref 150.0–400.0)
RBC: 4.21 Mil/uL (ref 3.87–5.11)
RDW: 12.1 % (ref 11.5–15.5)
WBC: 6 10*3/uL (ref 4.0–10.5)

## 2015-05-05 LAB — BASIC METABOLIC PANEL
BUN: 13 mg/dL (ref 6–23)
CALCIUM: 10 mg/dL (ref 8.4–10.5)
CO2: 28 meq/L (ref 19–32)
CREATININE: 0.78 mg/dL (ref 0.40–1.20)
Chloride: 103 mEq/L (ref 96–112)
GFR: 88.95 mL/min (ref >60.00)
GLUCOSE: 88 mg/dL (ref 70–99)
Potassium: 5 mEq/L (ref 3.5–5.1)
SODIUM: 137 meq/L (ref 135–145)

## 2015-05-05 LAB — LIPID PANEL
CHOLESTEROL: 156 mg/dL (ref 0–200)
HDL: 92.6 mg/dL (ref >39.00)
LDL CALC: 56 mg/dL (ref 0–99)
NonHDL: 63.37
Total CHOL/HDL Ratio: 2
Triglycerides: 36 mg/dL (ref 0.0–149.0)
VLDL: 7.2 mg/dL (ref 0.0–40.0)

## 2015-05-05 LAB — HEPATIC FUNCTION PANEL
ALT: 15 U/L (ref 0–35)
AST: 17 U/L (ref 0–37)
Albumin: 4.7 g/dL (ref 3.5–5.2)
Alkaline Phosphatase: 36 U/L — ABNORMAL LOW (ref 39–117)
BILIRUBIN TOTAL: 0.9 mg/dL (ref 0.2–1.2)
Bilirubin, Direct: 0.2 mg/dL (ref 0.0–0.3)
Total Protein: 7.8 g/dL (ref 6.0–8.3)

## 2015-05-05 LAB — TSH: TSH: 1.58 u[IU]/mL (ref 0.35–4.50)

## 2016-09-27 ENCOUNTER — Encounter: Payer: Self-pay | Admitting: Family Medicine

## 2016-10-01 ENCOUNTER — Ambulatory Visit (INDEPENDENT_AMBULATORY_CARE_PROVIDER_SITE_OTHER): Payer: 59 | Admitting: Family Medicine

## 2016-10-01 ENCOUNTER — Encounter: Payer: Self-pay | Admitting: Family Medicine

## 2016-10-01 VITALS — BP 96/67 | HR 107 | Temp 100.7°F | Ht 63.5 in | Wt 132.0 lb

## 2016-10-01 DIAGNOSIS — J018 Other acute sinusitis: Secondary | ICD-10-CM | POA: Diagnosis not present

## 2016-10-01 MED ORDER — AMOXICILLIN-POT CLAVULANATE 875-125 MG PO TABS: 1.0000 | ORAL_TABLET | Freq: Two times a day (BID) | ORAL | 0 refills | Status: DC

## 2016-10-01 NOTE — Progress Notes (Signed)
   Subjective:    Patient ID: Reginold Agent, female    DOB: 23-Apr-1980, 36 y.o.   MRN: [id number]  HPI Here for 2 days of headache, sinus pressure, PND, dry cough, and fever to 102 degrees. Using Nyquil and drinking fluids.    Review of Systems  Constitutional: Positive for fever.  HENT: Positive for congestion, postnasal drip, sinus pain, sinus pressure and sore throat.   Eyes: Negative.   Respiratory: Positive for cough.        Objective:   Physical Exam  Constitutional: She appears well-developed and well-nourished.  HENT:  Right Ear: External ear normal.  Left Ear: External ear normal.  Nose: Nose normal.  Mouth/Throat: Oropharynx is clear and moist.  Eyes: Conjunctivae are normal.  Neck: No thyromegaly present.  Cardiovascular: Normal rate, regular rhythm and normal heart sounds.   Pulmonary/Chest: Effort normal and breath sounds normal. No respiratory distress. She has no wheezes. She has no rales.  Lymphadenopathy:    She has no cervical adenopathy.          Assessment & Plan:  Sinusitis, treat with Augmentin. Add Mucinex and Advil prn. Written out of work today and tomorrow.  Alysia Penna, MD

## 2016-10-01 NOTE — Patient Instructions (Signed)
WE NOW OFFER   [facility] FAST TRACK!!!  SAME DAY Appointments for ACUTE CARE  Such as: Sprains, Injuries, cuts, abrasions, rashes, muscle pain, joint pain, back pain Colds, flu, sore throats, headache, allergies, cough, fever  Ear pain, sinus and eye infections Abdominal pain, nausea, vomiting, diarrhea, upset stomach Animal/insect bites  3 Easy Ways to Schedule: Walk-In Scheduling Call in scheduling Mychart Sign-up: [email or web address]         

## 2016-10-14 DIAGNOSIS — R05 Cough: Secondary | ICD-10-CM | POA: Diagnosis not present

## 2016-10-14 DIAGNOSIS — J0140 Acute pansinusitis, unspecified: Secondary | ICD-10-CM | POA: Diagnosis not present

## 2016-10-14 DIAGNOSIS — J340 Abscess, furuncle and carbuncle of nose: Secondary | ICD-10-CM | POA: Diagnosis not present

## 2016-10-17 ENCOUNTER — Encounter: Payer: Self-pay | Admitting: Family Medicine

## 2016-10-17 ENCOUNTER — Ambulatory Visit (INDEPENDENT_AMBULATORY_CARE_PROVIDER_SITE_OTHER): Payer: 59 | Admitting: Family Medicine

## 2016-10-17 VITALS — Temp 98.6°F | Ht 63.5 in | Wt 134.0 lb

## 2016-10-17 DIAGNOSIS — J0120 Acute ethmoidal sinusitis, unspecified: Secondary | ICD-10-CM

## 2016-10-17 MED ORDER — METHYLPREDNISOLONE 4 MG PO TBPK: ORAL_TABLET | ORAL | 0 refills | Status: DC

## 2016-10-17 MED ORDER — LEVOFLOXACIN 500 MG PO TABS: 500.0000 mg | ORAL_TABLET | Freq: Every day | ORAL | 0 refills | Status: AC

## 2016-10-17 NOTE — Progress Notes (Signed)
   Subjective:    Patient ID: Rebecca Middleton, female    DOB: 02-15-1980, 36 y.o.   MRN: [id number]  HPI Here for a sinus infection that has not responded to meds. She was seen here on 10-01-16 and was given Augmentin. She did not improve however and was seen in Urgent Care on 10-14-16 and was switched to Doxycycline. However she still has sinus pressure, headache, blowing green mucus from the nose and a ST. No cough  Or fever.   Review of Systems  Constitutional: Negative.   HENT: Positive for congestion, postnasal drip, sinus pain, sinus pressure and sore throat.   Eyes: Negative.   Respiratory: Negative.        Objective:   Physical Exam  Constitutional: She appears well-developed and well-nourished.  HENT:  Right Ear: External ear normal.  Left Ear: External ear normal.  Nose: Nose normal.  Mouth/Throat: Oropharynx is clear and moist.  Tender to percussion over the ethmoid sinuses   Eyes: Conjunctivae are normal.  Neck: Neck supple. No thyromegaly present.  Pulmonary/Chest: Effort normal and breath sounds normal. No respiratory distress. She has no wheezes. She has no rales.  Lymphadenopathy:    She has no cervical adenopathy.          Assessment & Plan:  Sinusitis, switch to Levaquin for 10 days. Add a Medrol dose pack.  Alysia Penna, MD

## 2016-10-17 NOTE — Patient Instructions (Signed)
WE NOW OFFER   [facility] FAST TRACK!!!  SAME DAY Appointments for ACUTE CARE  Such as: Sprains, Injuries, cuts, abrasions, rashes, muscle pain, joint pain, back pain Colds, flu, sore throats, headache, allergies, cough, fever  Ear pain, sinus and eye infections Abdominal pain, nausea, vomiting, diarrhea, upset stomach Animal/insect bites  3 Easy Ways to Schedule: Walk-In Scheduling Call in scheduling Mychart Sign-up: [email or web address]         

## 2016-10-22 ENCOUNTER — Encounter: Payer: Self-pay | Admitting: Family Medicine

## 2016-10-22 ENCOUNTER — Ambulatory Visit (INDEPENDENT_AMBULATORY_CARE_PROVIDER_SITE_OTHER): Payer: 59 | Admitting: Family Medicine

## 2016-10-22 VITALS — BP 98/70 | Temp 99.0°F | Ht 63.5 in | Wt 131.0 lb

## 2016-10-22 DIAGNOSIS — J0110 Acute frontal sinusitis, unspecified: Secondary | ICD-10-CM | POA: Diagnosis not present

## 2016-10-22 DIAGNOSIS — J3489 Other specified disorders of nose and nasal sinuses: Secondary | ICD-10-CM | POA: Diagnosis not present

## 2016-10-22 NOTE — Progress Notes (Signed)
   Subjective:    Patient ID: Rebecca Middleton, female    DOB: [birth year], 36 y.o.   MRN: [id number]  HPI Here for continued sinus pressure and pain with headaches. She was here last week and was given Levaquin and a Medrol dose pack. For the first 2 days of the dose pack she felt a little better, but over te past 2 days her symptoms have worsened and she is back to where she was. No cough or fever.     Review of Systems  Constitutional: Negative.   HENT: Positive for congestion, sinus pain and sinus pressure. Negative for postnasal drip and sore throat.   Eyes: Negative.   Respiratory: Negative.        Objective:   Physical Exam  Constitutional: She appears well-developed and well-nourished.  HENT:  Right Ear: External ear normal.  Left Ear: External ear normal.  Nose: Nose normal.  Mouth/Throat: Oropharynx is clear and moist.  Eyes: Conjunctivae are normal.  Neck: No thyromegaly present.  Pulmonary/Chest: Effort normal and breath sounds normal. No respiratory distress. She has no wheezes. She has no rales.  Lymphadenopathy:    She has no cervical adenopathy.          Assessment & Plan:  Sinusitis, which has not responded to 3 trials of antibiotics and prednisone. We will refer her to ENT to evaluate further.  Alysia Penna, MD

## 2016-10-22 NOTE — Patient Instructions (Signed)
WE NOW OFFER   [facility] FAST TRACK!!!  SAME DAY Appointments for ACUTE CARE  Such as: Sprains, Injuries, cuts, abrasions, rashes, muscle pain, joint pain, back pain Colds, flu, sore throats, headache, allergies, cough, fever  Ear pain, sinus and eye infections Abdominal pain, nausea, vomiting, diarrhea, upset stomach Animal/insect bites  3 Easy Ways to Schedule: Walk-In Scheduling Call in scheduling Mychart Sign-up: [email or web address]         

## 2016-10-23 ENCOUNTER — Other Ambulatory Visit: Payer: Self-pay | Admitting: Otolaryngology

## 2016-10-23 DIAGNOSIS — J340 Abscess, furuncle and carbuncle of nose: Secondary | ICD-10-CM

## 2016-10-25 ENCOUNTER — Ambulatory Visit
Admission: RE | Admit: 2016-10-25 | Discharge: 2016-10-25 | Disposition: A | Discharge status: Home / Self Care | Payer: 59 | Source: Ambulatory Visit | Attending: Otolaryngology | Admitting: Otolaryngology

## 2016-10-25 DIAGNOSIS — J340 Abscess, furuncle and carbuncle of nose: Secondary | ICD-10-CM

## 2017-03-21 ENCOUNTER — Encounter: Payer: 59 | Admitting: Family Medicine

## 2017-04-04 ENCOUNTER — Encounter: Payer: Self-pay | Admitting: Family Medicine

## 2017-04-04 ENCOUNTER — Ambulatory Visit (INDEPENDENT_AMBULATORY_CARE_PROVIDER_SITE_OTHER): Payer: 59 | Admitting: Family Medicine

## 2017-04-04 VITALS — BP 102/60 | HR 80 | Temp 98.3°F | Ht 63.75 in | Wt 135.8 lb

## 2017-04-04 DIAGNOSIS — R1084 Generalized abdominal pain: Secondary | ICD-10-CM

## 2017-04-04 DIAGNOSIS — Z0000 Encounter for general adult medical examination without abnormal findings: Secondary | ICD-10-CM | POA: Diagnosis not present

## 2017-04-04 DIAGNOSIS — Z209 Contact with and (suspected) exposure to unspecified communicable disease: Secondary | ICD-10-CM | POA: Diagnosis not present

## 2017-04-04 LAB — HEPATIC FUNCTION PANEL
ALBUMIN: 4.4 g/dL (ref 3.5–5.2)
ALK PHOS: 48 U/L (ref 39–117)
ALT: 14 U/L (ref 0–35)
AST: 17 U/L (ref 0–37)
Bilirubin, Direct: 0.1 mg/dL (ref 0.0–0.3)
TOTAL PROTEIN: 7.8 g/dL (ref 6.0–8.3)
Total Bilirubin: 0.5 mg/dL (ref 0.2–1.2)

## 2017-04-04 LAB — CBC WITH DIFFERENTIAL/PLATELET
BASOS ABS: 0 10*3/uL (ref 0.0–0.1)
BASOS PCT: 0.7 % (ref 0.0–3.0)
EOS ABS: 0.1 10*3/uL (ref 0.0–0.7)
Eosinophils Relative: 1.1 % (ref 0.0–5.0)
HCT: 37.3 % (ref 36.0–46.0)
Hemoglobin: 12.7 g/dL (ref 12.0–15.0)
LYMPHS ABS: 1.6 10*3/uL (ref 0.7–4.0)
Lymphocytes Relative: 23.9 % (ref 12.0–46.0)
MCHC: 34.2 g/dL (ref 30.0–36.0)
MCV: 91 fl (ref 78.0–100.0)
MONOS PCT: 7.3 % (ref 3.0–12.0)
Monocytes Absolute: 0.5 10*3/uL (ref 0.1–1.0)
NEUTROS PCT: 67 % (ref 43.0–77.0)
Neutro Abs: 4.4 10*3/uL (ref 1.4–7.7)
PLATELETS: 349 10*3/uL (ref 150.0–400.0)
RBC: 4.1 Mil/uL (ref 3.87–5.11)
RDW: 12.5 % (ref 11.5–15.5)
WBC: 6.6 10*3/uL (ref 4.0–10.5)

## 2017-04-04 LAB — LIPID PANEL
CHOLESTEROL: 143 mg/dL (ref 0–200)
HDL: 81.9 mg/dL (ref >39.00)
LDL Cholesterol: 54 mg/dL (ref 0–99)
NonHDL: 61.25
Total CHOL/HDL Ratio: 2
Triglycerides: 35 mg/dL (ref 0.0–149.0)
VLDL: 7 mg/dL (ref 0.0–40.0)

## 2017-04-04 LAB — BASIC METABOLIC PANEL
BUN: 13 mg/dL (ref 6–23)
CHLORIDE: 102 meq/L (ref 96–112)
CO2: 27 meq/L (ref 19–32)
Calcium: 10 mg/dL (ref 8.4–10.5)
Creatinine, Ser: 0.74 mg/dL (ref 0.40–1.20)
GFR: 93.52 mL/min (ref >60.00)
GLUCOSE: 103 mg/dL — AB (ref 70–99)
POTASSIUM: 5.1 meq/L (ref 3.5–5.1)
Sodium: 137 mEq/L (ref 135–145)

## 2017-04-04 LAB — TSH: TSH: 1.68 u[IU]/mL (ref 0.35–4.50)

## 2017-04-04 NOTE — Progress Notes (Signed)
   Subjective:    Patient ID: Rebecca Middleton, female    DOB: May 27, 1980, 37 y.o.   MRN: [id number]  HPI Here for a well exam. She feels fine but has some intermittent abdominal cramps. She used to average one BM a week but she has reduced her gluten intake and she feels better. She now as 4-5 BMs a week. She needs a rabies titer for her job.    Review of Systems  Constitutional: Negative.   HENT: Negative.   Eyes: Negative.   Respiratory: Negative.   Cardiovascular: Negative.   Gastrointestinal: Negative.   Genitourinary: Negative for decreased urine volume, difficulty urinating, dyspareunia, dysuria, enuresis, flank pain, frequency, hematuria, pelvic pain and urgency.  Musculoskeletal: Negative.   Skin: Negative.   Neurological: Negative.   Psychiatric/Behavioral: Negative.        Objective:   Physical Exam  Constitutional: She is oriented to person, place, and time. She appears well-developed and well-nourished. No distress.  HENT:  Head: Normocephalic and atraumatic.  Right Ear: External ear normal.  Left Ear: External ear normal.  Nose: Nose normal.  Mouth/Throat: Oropharynx is clear and moist. No oropharyngeal exudate.  Eyes: Pupils are equal, round, and reactive to light. Conjunctivae and EOM are normal. No scleral icterus.  Neck: Normal range of motion. Neck supple. No JVD present. No thyromegaly present.  Cardiovascular: Normal rate, regular rhythm, normal heart sounds and intact distal pulses. Exam reveals no gallop and no friction rub.  No murmur heard. Pulmonary/Chest: Effort normal and breath sounds normal. No respiratory distress. She has no wheezes. She has no rales. She exhibits no tenderness.  Abdominal: Soft. Bowel sounds are normal. She exhibits no distension and no mass. There is no tenderness. There is no rebound and no guarding.  Musculoskeletal: Normal range of motion. She exhibits no edema or tenderness.  Lymphadenopathy:    She has no cervical  adenopathy.  Neurological: She is alert and oriented to person, place, and time. She has normal reflexes. No cranial nerve deficit. She exhibits normal muscle tone. Coordination normal.  Skin: Skin is warm and dry. No rash noted. No erythema.  Psychiatric: She has a normal mood and affect. Her behavior is normal. Judgment and thought content normal.          Assessment & Plan:  Well exam. We discussed diet and exercise. Get fasting labs including a celiac panel and a rabies titer.  Alysia Penna, MD

## 2017-04-08 LAB — CELIAC PANEL 10
Antigliadin Abs, IgA: 6 units (ref 0–19)
ENDOMYSIAL IGA: NEGATIVE
Gliadin IgG: 9 units (ref 0–19)
IgA/Immunoglobulin A, Serum: 329 mg/dL (ref 87–352)

## 2017-04-19 LAB — RABIES VIRUS ANTIBODY TITER: RABIES RESPONSE: 0.8 [IU]/mL

## 2017-05-09 DIAGNOSIS — Z01419 Encounter for gynecological examination (general) (routine) without abnormal findings: Secondary | ICD-10-CM | POA: Diagnosis not present

## 2017-05-09 DIAGNOSIS — Z6823 Body mass index (BMI) 23.0-23.9, adult: Secondary | ICD-10-CM | POA: Diagnosis not present

## 2017-05-09 DIAGNOSIS — N952 Postmenopausal atrophic vaginitis: Secondary | ICD-10-CM | POA: Diagnosis not present

## 2017-11-16 DIAGNOSIS — Z23 Encounter for immunization: Secondary | ICD-10-CM | POA: Diagnosis not present

## 2017-11-20 ENCOUNTER — Ambulatory Visit: Payer: 59 | Admitting: Family Medicine

## 2017-11-20 ENCOUNTER — Encounter: Payer: Self-pay | Admitting: Family Medicine

## 2017-11-20 VITALS — BP 104/62 | HR 81 | Temp 98.0°F | Wt 141.4 lb

## 2017-11-20 DIAGNOSIS — L03114 Cellulitis of left upper limb: Secondary | ICD-10-CM | POA: Diagnosis not present

## 2017-11-20 MED ORDER — AMOXICILLIN-POT CLAVULANATE 875-125 MG PO TABS: 1.0000 | ORAL_TABLET | Freq: Two times a day (BID) | ORAL | 0 refills | Status: DC

## 2017-11-20 NOTE — Progress Notes (Signed)
   Subjective:    Patient ID: Rebecca Middleton, female    DOB: 1980/08/09, 37 y.o.   MRN: [id number]  HPI Here for a cat bite that occurred several hours ago at work. While she was working with a cat that was coming out of anesthesia, the cat suddenly bit her left hand. The hand has been puffy and tender since then. No fever.    Review of Systems  Constitutional: Negative.   Respiratory: Negative.   Cardiovascular: Negative.   Skin: Positive for wound.       Objective:   Physical Exam  Constitutional: She appears well-developed and well-nourished.  Cardiovascular: Normal rate, regular rhythm, normal heart sounds and intact distal pulses.  Pulmonary/Chest: Effort normal and breath sounds normal.  Skin:  The dorsal left hand has a small puncture wound surrounded by an area of swelling and slight erythema. This is tender. The fingers show full ROM           Assessment & Plan:  Cat bite with early cellulitis. Treat with Augmentin for 10 days. Ice and elevated the hand tonight. Recheck prn. Use Ibuprofen as needed. Alysia Penna, MD

## 2017-11-22 ENCOUNTER — Telehealth: Payer: Self-pay | Admitting: *Deleted

## 2017-11-22 ENCOUNTER — Encounter: Payer: Self-pay | Admitting: *Deleted

## 2017-11-22 NOTE — Telephone Encounter (Signed)
  11/22/2017-Note was approved by Dr Sarajane Jews.  Completed and given to the pt. Wendie Simmer    Copied from [place] [id number]. Topic: General - Other >> Nov 21, 2017  3:35 PM Sheran Luz wrote: Reason for CRM: Patient called to inquire if she could stop by office to pick up work note for today 11/14. Patient was seen yesterday and forgot to pick it up while leaving.

## 2019-02-23 ENCOUNTER — Encounter: Payer: 59 | Admitting: Family Medicine

## 2019-05-07 IMAGING — CT CT MAXILLOFACIAL W/O CM
3 of 4 series · 17 of 37 positions shown, 19 images · non-contrast
Comparison: None.

CLINICAL DATA: Cellulitis of nasal tip.  Continued evaluation.

EXAM:
CT MAXILLOFACIAL WITHOUT CONTRAST
TECHNIQUE: Multidetector CT imaging of the maxillofacial structures was
performed. Multiplanar CT image reconstructions were also generated.

[Series 3: ax bone · axial · 0.38mm/px · z∈[-146,-18]mm · 8 of 67 slices shown, 10 images]
[im 8/67  brain]
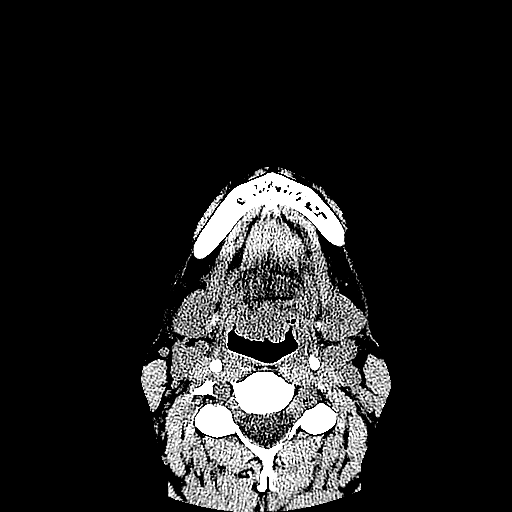
[im 8/67  bone]
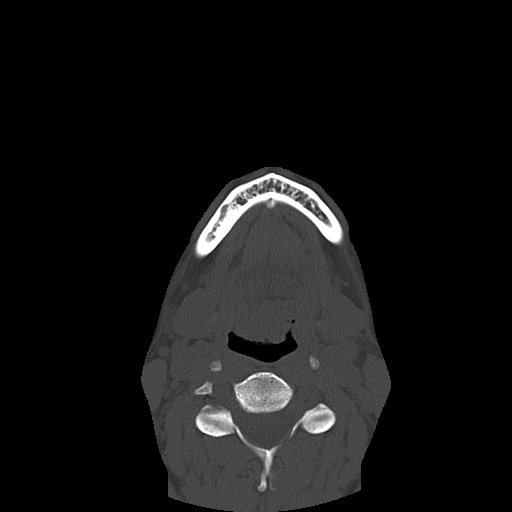
[im 15/67  bone]
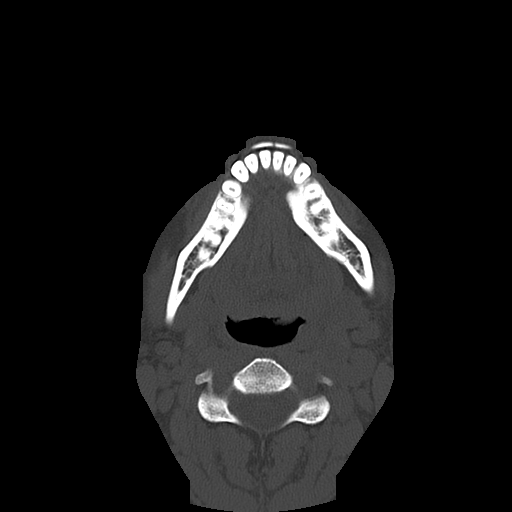
[im 23/67  bone]
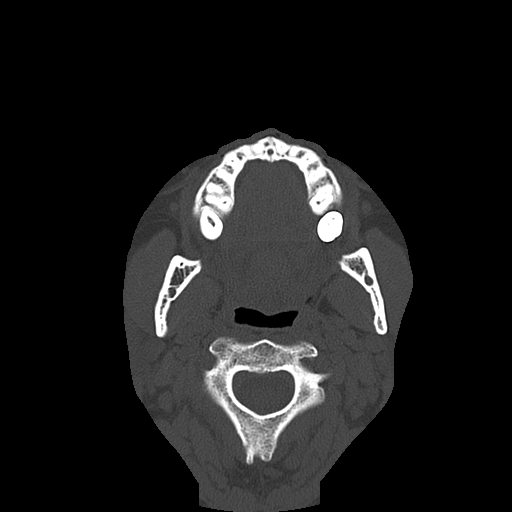
[im 30/67  bone]
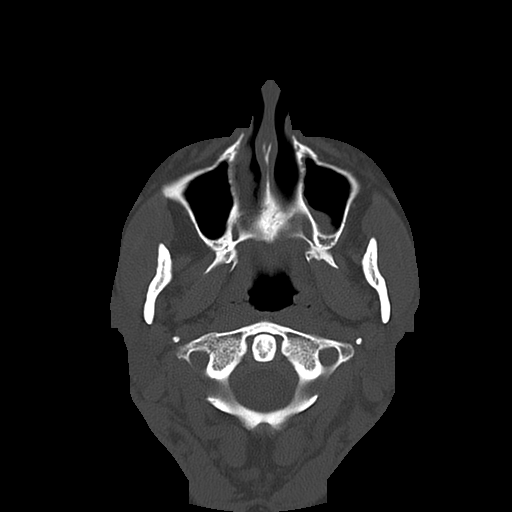
[im 37/67  brain]
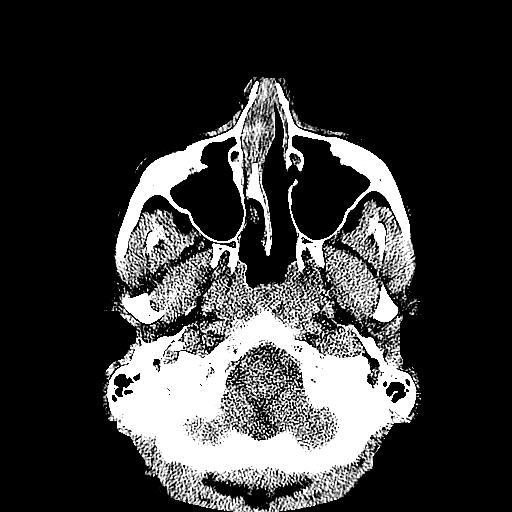
[im 37/67  bone]
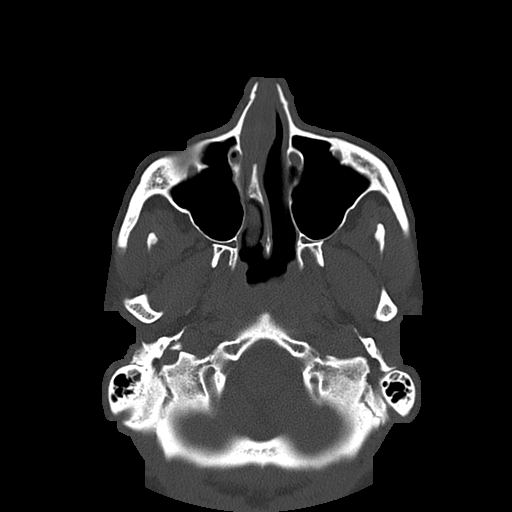
[im 45/67  bone]
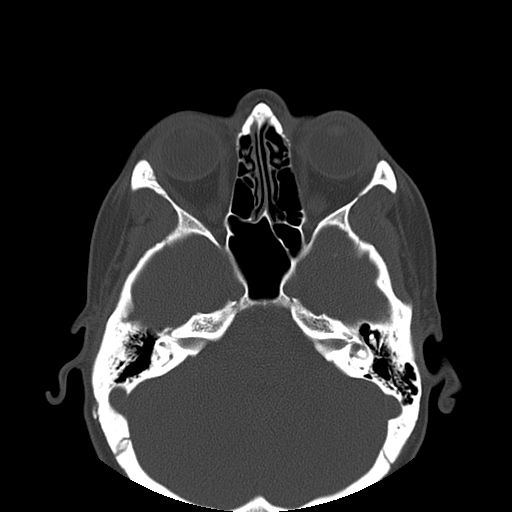
[im 52/67  bone]
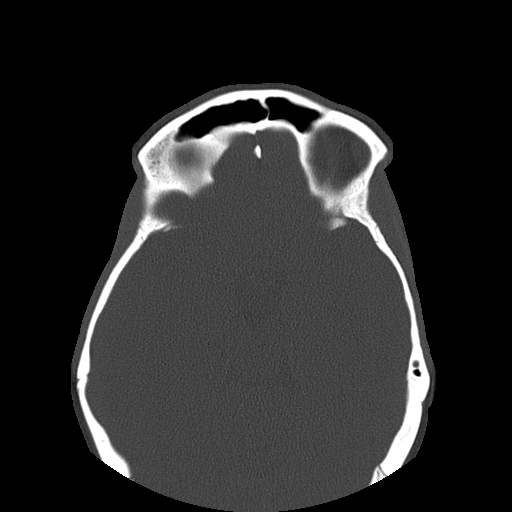
[im 59/67  bone]
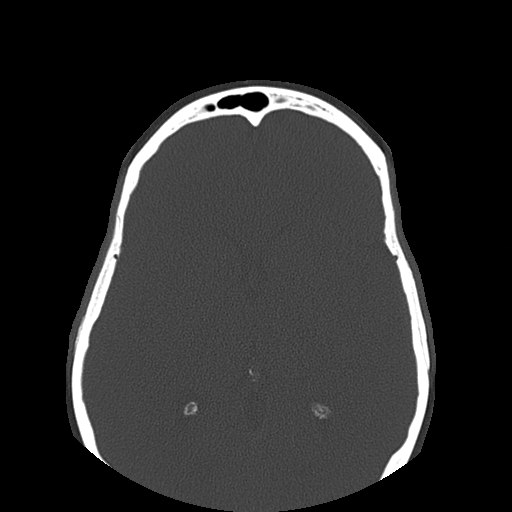

[Series 601: coronal facial · coronal · 0.38mm/px · 3 of 87 slices shown]
[im 29/87  bone]
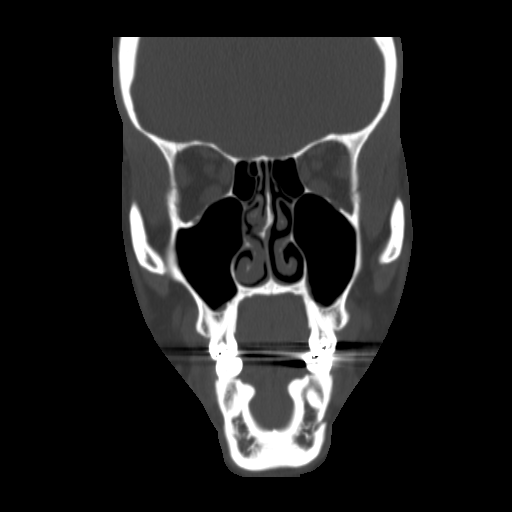
[im 44/87  bone]
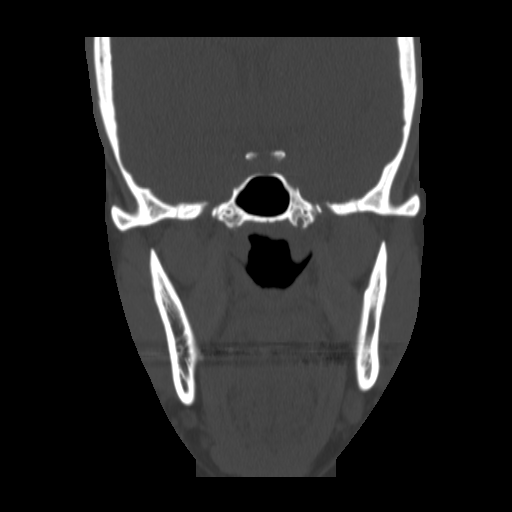
[im 58/87  bone]
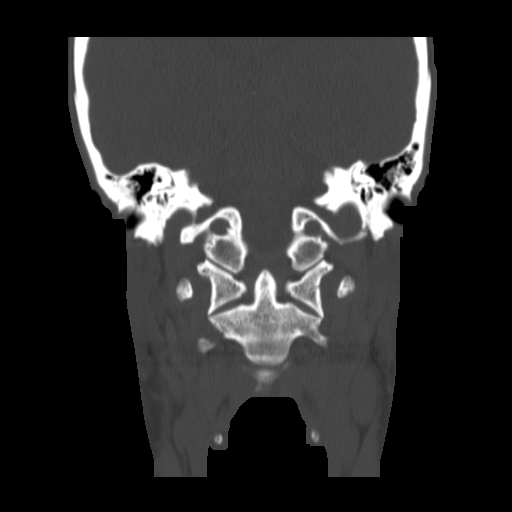

[Series 602: sagittal facial · sagittal · 0.38mm/px · 6 of 78 slices shown]
[im 8/78  bone]
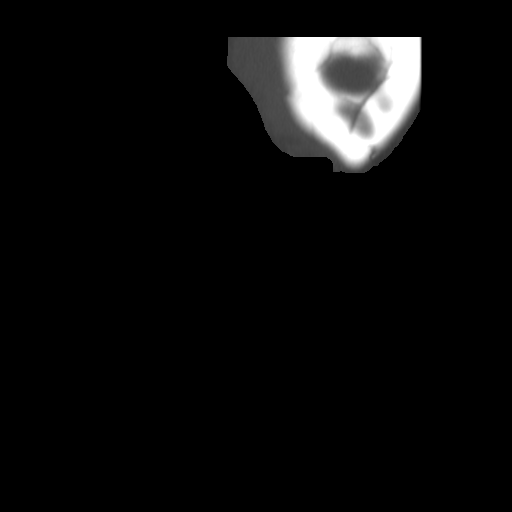
[im 15/78  bone]
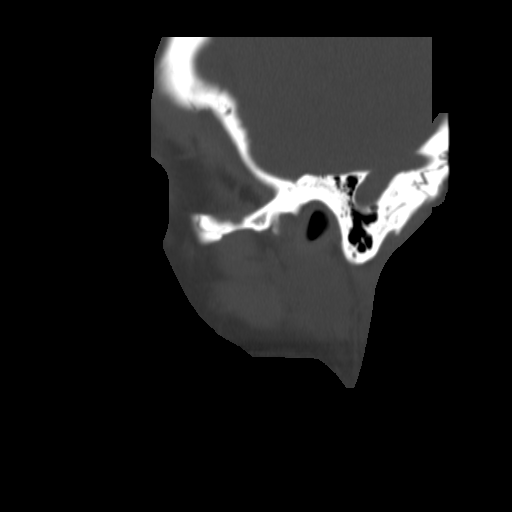
[im 29/78  bone]
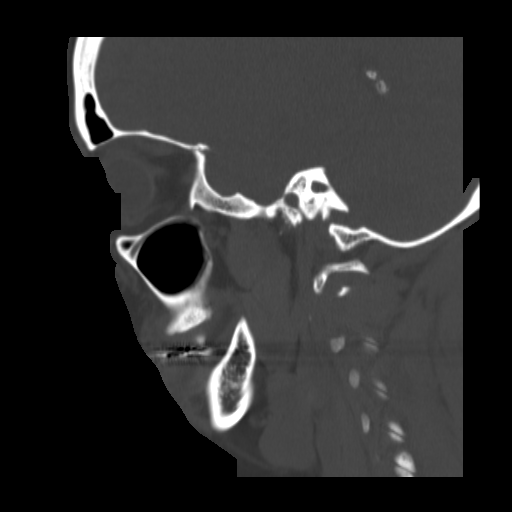
[im 36/78  bone]
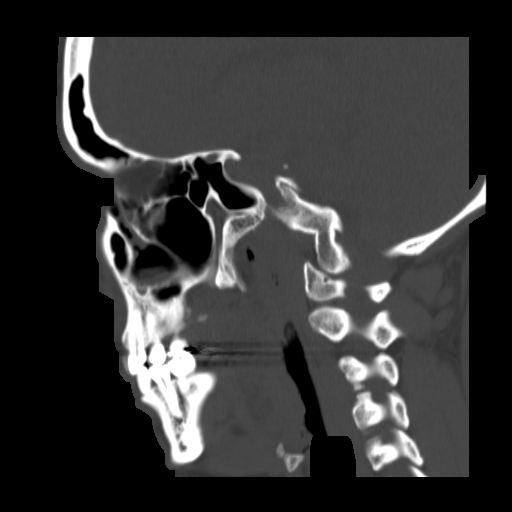
[im 43/78  bone]
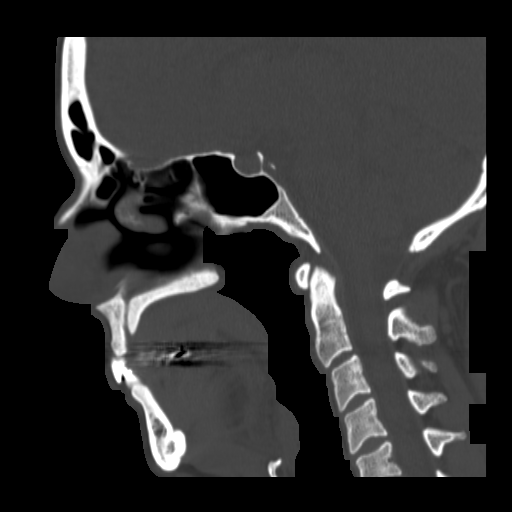
[im 50/78  bone]
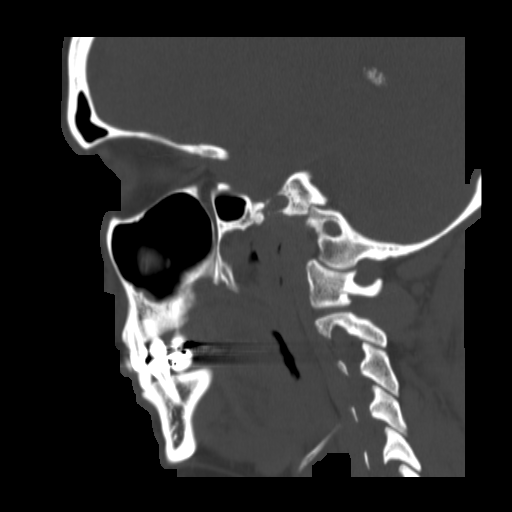

[17 of 37 positions shown; findings below may reference images not displayed]

FINDINGS: Osseous: There is a small defect in the LEFT paramedian nasal bone.
See axial series 3 image 30, also coronal series 601, image 8.
Correlate clinically for osseous destruction due to infection. No
other osseous findings.

Orbits: Negative. No traumatic or inflammatory finding.

Sinuses: Paranasal sinuses are clear. There is nasal septal
deviation LEFT-to-RIGHT with spurring of approximately 4 mm.

Soft tissues: Soft tissue swelling is seen of the nasal tip. There
is no frank abscess.

Limited intracranial: Negative.
IMPRESSION: Cellulitis of the nasal tip. Small defect in the LEFT paramedian
nasal bone could represent osseous destruction due to infection.
Correlate clinically. See discussion above.

## 2019-06-26 ENCOUNTER — Ambulatory Visit (INDEPENDENT_AMBULATORY_CARE_PROVIDER_SITE_OTHER): Payer: 59 | Admitting: Family Medicine

## 2019-06-26 ENCOUNTER — Encounter: Payer: Self-pay | Admitting: Family Medicine

## 2019-06-26 ENCOUNTER — Other Ambulatory Visit: Payer: Self-pay

## 2019-06-26 VITALS — BP 110/62 | HR 89 | Temp 97.8°F | Ht 63.75 in | Wt 143.2 lb

## 2019-06-26 DIAGNOSIS — R000 Tachycardia, unspecified: Secondary | ICD-10-CM

## 2019-06-26 DIAGNOSIS — Z0000 Encounter for general adult medical examination without abnormal findings: Secondary | ICD-10-CM

## 2019-06-26 LAB — HEPATIC FUNCTION PANEL
ALT: 13 U/L (ref 0–35)
AST: 16 U/L (ref 0–37)
Albumin: 4.5 g/dL (ref 3.5–5.2)
Alkaline Phosphatase: 41 U/L (ref 39–117)
Bilirubin, Direct: 0.1 mg/dL (ref 0.0–0.3)
Total Bilirubin: 0.5 mg/dL (ref 0.2–1.2)
Total Protein: 7.5 g/dL (ref 6.0–8.3)

## 2019-06-26 LAB — LIPID PANEL
Cholesterol: 149 mg/dL (ref 0–200)
HDL: 86.6 mg/dL (ref >39.00)
LDL Cholesterol: 52 mg/dL (ref 0–99)
NonHDL: 62.17
Total CHOL/HDL Ratio: 2
Triglycerides: 51 mg/dL (ref 0.0–149.0)
VLDL: 10.2 mg/dL (ref 0.0–40.0)

## 2019-06-26 LAB — CBC WITH DIFFERENTIAL/PLATELET
Basophils Absolute: 0 10*3/uL (ref 0.0–0.1)
Basophils Relative: 0.6 % (ref 0.0–3.0)
Eosinophils Absolute: 0.1 10*3/uL (ref 0.0–0.7)
Eosinophils Relative: 1.4 % (ref 0.0–5.0)
HCT: 36.7 % (ref 36.0–46.0)
Hemoglobin: 12.4 g/dL (ref 12.0–15.0)
Lymphocytes Relative: 29.4 % (ref 12.0–46.0)
Lymphs Abs: 2.1 10*3/uL (ref 0.7–4.0)
MCHC: 33.9 g/dL (ref 30.0–36.0)
MCV: 94.5 fl (ref 78.0–100.0)
Monocytes Absolute: 0.5 10*3/uL (ref 0.1–1.0)
Monocytes Relative: 7.4 % (ref 3.0–12.0)
Neutro Abs: 4.3 10*3/uL (ref 1.4–7.7)
Neutrophils Relative %: 61.2 % (ref 43.0–77.0)
Platelets: 307 10*3/uL (ref 150.0–400.0)
RBC: 3.88 Mil/uL (ref 3.87–5.11)
RDW: 12.2 % (ref 11.5–15.5)
WBC: 7 10*3/uL (ref 4.0–10.5)

## 2019-06-26 LAB — BASIC METABOLIC PANEL
BUN: 10 mg/dL (ref 6–23)
CO2: 27 mEq/L (ref 19–32)
Calcium: 9.6 mg/dL (ref 8.4–10.5)
Chloride: 104 mEq/L (ref 96–112)
Creatinine, Ser: 0.74 mg/dL (ref 0.40–1.20)
GFR: 86.97 mL/min (ref >60.00)
Glucose, Bld: 90 mg/dL (ref 70–99)
Potassium: 4.1 mEq/L (ref 3.5–5.1)
Sodium: 136 mEq/L (ref 135–145)

## 2019-06-26 LAB — URINALYSIS
Bilirubin Urine: NEGATIVE
Hgb urine dipstick: NEGATIVE
Ketones, ur: NEGATIVE
Leukocytes,Ua: NEGATIVE
Nitrite: NEGATIVE
Specific Gravity, Urine: 1.015 (ref 1.000–1.030)
Total Protein, Urine: NEGATIVE
Urine Glucose: NEGATIVE
Urobilinogen, UA: 0.2 (ref 0.0–1.0)
pH: 7.5 (ref 5.0–8.0)

## 2019-06-26 LAB — T4, FREE: Free T4: 0.74 ng/dL (ref 0.60–1.60)

## 2019-06-26 LAB — T3, FREE: T3, Free: 3.3 pg/mL (ref 2.3–4.2)

## 2019-06-26 LAB — TSH: TSH: 1.29 u[IU]/mL (ref 0.35–4.50)

## 2019-06-26 LAB — VITAMIN B12: Vitamin B-12: 166 pg/mL — ABNORMAL LOW (ref 211–911)

## 2019-06-26 NOTE — Progress Notes (Signed)
Subjective:    Patient ID: Rebecca Middleton, female    DOB: 1980/09/29, 39 y.o.   MRN: [id number]  HPI Here for a well exam. She feels great except for one issue. Over the year she has developed episodes where she feels her heart racing. It does not skip, but it beats fast. She wears a Fit Bit, and her resting rate of around 80 will suddenly jump up to 100-110. This lasts for about 5 minutes and then stops. She has no other symptoms during theses spells, no chest pain or SOB. She does not have to stop to sit down or rest. These occur once every 2-3 days. They can happen during the day and they sometimes wake her up at night. She walks most days and runs on her Peliton machine several days a week. She drinks 2 cups of coffee every morning, and then gets no more caffeine the rest of the day. She takes no vitamins or supplements.    Review of Systems  Constitutional: Negative.   HENT: Negative.   Eyes: Negative.   Respiratory: Negative.   Cardiovascular: Positive for palpitations.  Gastrointestinal: Negative.   Genitourinary: Negative for decreased urine volume, difficulty urinating, dyspareunia, dysuria, enuresis, flank pain, frequency, hematuria, pelvic pain and urgency.  Musculoskeletal: Negative.   Skin: Negative.   Neurological: Negative.   Psychiatric/Behavioral: Negative.        Objective:   Physical Exam Constitutional:      General: She is not in acute distress.    Appearance: She is well-developed.  HENT:     Head: Normocephalic and atraumatic.     Right Ear: External ear normal.     Left Ear: External ear normal.     Nose: Nose normal.     Mouth/Throat:     Pharynx: No oropharyngeal exudate.  Eyes:     General: No scleral icterus.    Conjunctiva/sclera: Conjunctivae normal.     Pupils: Pupils are equal, round, and reactive to light.  Neck:     Thyroid: No thyromegaly.     Vascular: No JVD.  Cardiovascular:     Rate and Rhythm: Normal rate and regular rhythm.      Pulses: Normal pulses.     Heart sounds: Normal heart sounds. No murmur heard.  No friction rub. No gallop.      Comments: EKG normal today  Pulmonary:     Effort: Pulmonary effort is normal. No respiratory distress.     Breath sounds: Normal breath sounds. No wheezing or rales.  Chest:     Chest wall: No tenderness.  Abdominal:     General: Bowel sounds are normal. There is no distension.     Palpations: Abdomen is soft. There is no mass.     Tenderness: There is no abdominal tenderness. There is no guarding or rebound.  Musculoskeletal:        General: No tenderness. Normal range of motion.     Cervical back: Normal range of motion and neck supple.  Lymphadenopathy:     Cervical: No cervical adenopathy.  Skin:    General: Skin is warm and dry.     Findings: No erythema or rash.  Neurological:     Mental Status: She is alert and oriented to person, place, and time.     Cranial Nerves: No cranial nerve deficit.     Motor: No abnormal muscle tone.     Coordination: Coordination normal.     Deep Tendon Reflexes: Reflexes are normal  and symmetric. Reflexes normal.  Psychiatric:        Behavior: Behavior normal.        Thought Content: Thought content normal.        Judgment: Judgment normal.           Assessment & Plan:  Well exam. We dicussed diet and exercise. Get fasting labs. She will monitor the episodes above. It they become more frequent or more symptomatic, we will investigate further.  Alysia Penna, MD

## 2019-07-16 ENCOUNTER — Telehealth: Payer: Self-pay | Admitting: Family Medicine

## 2019-07-16 NOTE — Telephone Encounter (Signed)
Pt is calling wanting to see if the B-12 medication can be called in to her pharmacy so that she is able to give it to herself due to her not having time to come and get it at the office.  Pharm:  [facility] in [place], [place]

## 2019-07-16 NOTE — Telephone Encounter (Signed)
Ok to self administer?  Please advise

## 2019-07-20 ENCOUNTER — Other Ambulatory Visit: Payer: Self-pay

## 2019-07-20 MED ORDER — CYANOCOBALAMIN 1000 MCG/ML IJ SOLN
INTRAMUSCULAR | 11 refills | Status: DC
Start: 2019-07-20 — End: 2019-08-14

## 2019-07-20 MED ORDER — "SYRINGE 23G X 1"" 3 ML MISC": 0 refills | Status: DC

## 2019-07-20 NOTE — Telephone Encounter (Signed)
Yes please call in supplies for her to give herself B12 shots

## 2019-07-20 NOTE — Telephone Encounter (Signed)
Rx sent 

## 2019-08-10 ENCOUNTER — Ambulatory Visit (INDEPENDENT_AMBULATORY_CARE_PROVIDER_SITE_OTHER): Payer: 59

## 2019-08-10 ENCOUNTER — Other Ambulatory Visit: Payer: Self-pay

## 2019-08-10 DIAGNOSIS — E538 Deficiency of other specified B group vitamins: Secondary | ICD-10-CM | POA: Diagnosis not present

## 2019-08-10 MED ORDER — CYANOCOBALAMIN 1000 MCG/ML IJ SOLN
1000.0000 ug | Freq: Once | INTRAMUSCULAR | Status: AC
Start: 2019-08-10 — End: 2019-08-10
  Administered 2019-08-10: 1000 ug via INTRAMUSCULAR

## 2019-08-10 NOTE — Patient Instructions (Signed)
Health Maintenance Due  Topic Date Due   Hepatitis C Screening  Never done   COVID-19 Vaccine (1) Never done   HIV Screening  Never done   PAP SMEAR-Modifier  Never done   INFLUENZA VACCINE  08/09/2019    No flowsheet data found.

## 2019-08-10 NOTE — Progress Notes (Signed)
Per orders of Laurey Morale, MD, injection of B12 given in right deltoid by Franco Collet. Patient tolerated injection well.  Lab Results  Component Value Date   VITAMINB12 166 (L) 06/26/2019

## 2019-08-14 ENCOUNTER — Other Ambulatory Visit: Payer: Self-pay | Admitting: Family Medicine

## 2019-08-17 ENCOUNTER — Ambulatory Visit (INDEPENDENT_AMBULATORY_CARE_PROVIDER_SITE_OTHER): Payer: 59 | Admitting: *Deleted

## 2019-08-17 ENCOUNTER — Other Ambulatory Visit: Payer: Self-pay

## 2019-08-17 DIAGNOSIS — E538 Deficiency of other specified B group vitamins: Secondary | ICD-10-CM | POA: Diagnosis not present

## 2019-08-17 MED ORDER — CYANOCOBALAMIN 1000 MCG/ML IJ SOLN
1000.0000 ug | Freq: Once | INTRAMUSCULAR | Status: AC
  Administered 2019-08-17: 1000 ug via INTRAMUSCULAR

## 2019-08-17 NOTE — Progress Notes (Signed)
Per orders of Dr. Fry, injection of B 12 given by Ariq Khamis S Avielle Imbert. Patient tolerated injection well.  

## 2019-08-24 ENCOUNTER — Ambulatory Visit (INDEPENDENT_AMBULATORY_CARE_PROVIDER_SITE_OTHER): Payer: 59

## 2019-08-24 ENCOUNTER — Other Ambulatory Visit: Payer: Self-pay

## 2019-08-24 DIAGNOSIS — E538 Deficiency of other specified B group vitamins: Secondary | ICD-10-CM | POA: Diagnosis not present

## 2019-08-24 MED ORDER — CYANOCOBALAMIN 1000 MCG/ML IJ SOLN
1000.0000 ug | Freq: Once | INTRAMUSCULAR | Status: AC
  Administered 2019-08-24: 1000 ug via INTRAMUSCULAR

## 2019-08-24 NOTE — Progress Notes (Signed)
Per orders of Dr. Fry, injection of Cyanocobalamin 1,000 mcg/mL given by Verbie Babic N Rithvik Orcutt. Patient tolerated injection well.  

## 2019-08-31 ENCOUNTER — Other Ambulatory Visit: Payer: Self-pay

## 2019-08-31 ENCOUNTER — Ambulatory Visit (INDEPENDENT_AMBULATORY_CARE_PROVIDER_SITE_OTHER): Payer: 59

## 2019-08-31 DIAGNOSIS — E538 Deficiency of other specified B group vitamins: Secondary | ICD-10-CM

## 2019-08-31 MED ORDER — CYANOCOBALAMIN 1000 MCG/ML IJ SOLN
1000.0000 ug | Freq: Once | INTRAMUSCULAR | Status: AC
  Administered 2019-08-31: 1000 ug via INTRAMUSCULAR

## 2019-08-31 NOTE — Progress Notes (Signed)
Per orders of Dr. Fry, injection of Cyanocobalamin 1,000 mcg/mL given by Retal Tonkinson N Krisalyn Yankowski. Patient tolerated injection well.  

## 2019-09-07 ENCOUNTER — Ambulatory Visit: Payer: 59

## 2020-06-30 ENCOUNTER — Ambulatory Visit (INDEPENDENT_AMBULATORY_CARE_PROVIDER_SITE_OTHER): Payer: 59 | Admitting: Family Medicine

## 2020-06-30 ENCOUNTER — Other Ambulatory Visit: Payer: Self-pay

## 2020-06-30 ENCOUNTER — Encounter: Payer: Self-pay | Admitting: Family Medicine

## 2020-06-30 VITALS — BP 98/76 | HR 91 | Temp 98.1°F | Wt 147.0 lb

## 2020-06-30 DIAGNOSIS — R000 Tachycardia, unspecified: Secondary | ICD-10-CM

## 2020-06-30 DIAGNOSIS — Z0000 Encounter for general adult medical examination without abnormal findings: Secondary | ICD-10-CM | POA: Diagnosis not present

## 2020-06-30 LAB — BASIC METABOLIC PANEL
BUN: 13 mg/dL (ref 6–23)
CO2: 27 mEq/L (ref 19–32)
Calcium: 9.8 mg/dL (ref 8.4–10.5)
Chloride: 103 mEq/L (ref 96–112)
Creatinine, Ser: 0.83 mg/dL (ref 0.40–1.20)
GFR: 87.89 mL/min (ref >60.00)
Glucose, Bld: 79 mg/dL (ref 70–99)
Potassium: 4.8 mEq/L (ref 3.5–5.1)
Sodium: 136 mEq/L (ref 135–145)

## 2020-06-30 LAB — CBC WITH DIFFERENTIAL/PLATELET
Basophils Absolute: 0 10*3/uL (ref 0.0–0.1)
Basophils Relative: 0.5 % (ref 0.0–3.0)
Eosinophils Absolute: 0.1 10*3/uL (ref 0.0–0.7)
Eosinophils Relative: 1.3 % (ref 0.0–5.0)
HCT: 35.7 % — ABNORMAL LOW (ref 36.0–46.0)
Hemoglobin: 12.4 g/dL (ref 12.0–15.0)
Lymphocytes Relative: 19.4 % (ref 12.0–46.0)
Lymphs Abs: 1.7 10*3/uL (ref 0.7–4.0)
MCHC: 34.7 g/dL (ref 30.0–36.0)
MCV: 92.4 fl (ref 78.0–100.0)
Monocytes Absolute: 0.6 10*3/uL (ref 0.1–1.0)
Monocytes Relative: 6.7 % (ref 3.0–12.0)
Neutro Abs: 6.3 10*3/uL (ref 1.4–7.7)
Neutrophils Relative %: 72.1 % (ref 43.0–77.0)
Platelets: 316 10*3/uL (ref 150.0–400.0)
RBC: 3.86 Mil/uL — ABNORMAL LOW (ref 3.87–5.11)
RDW: 12 % (ref 11.5–15.5)
WBC: 8.7 10*3/uL (ref 4.0–10.5)

## 2020-06-30 LAB — HEPATIC FUNCTION PANEL
ALT: 12 U/L (ref 0–35)
AST: 13 U/L (ref 0–37)
Albumin: 4.6 g/dL (ref 3.5–5.2)
Alkaline Phosphatase: 51 U/L (ref 39–117)
Bilirubin, Direct: 0.1 mg/dL (ref 0.0–0.3)
Total Bilirubin: 0.4 mg/dL (ref 0.2–1.2)
Total Protein: 7.5 g/dL (ref 6.0–8.3)

## 2020-06-30 LAB — LIPID PANEL
Cholesterol: 170 mg/dL (ref 0–200)
HDL: 80.7 mg/dL (ref >39.00)
LDL Cholesterol: 79 mg/dL (ref 0–99)
NonHDL: 88.95
Total CHOL/HDL Ratio: 2
Triglycerides: 52 mg/dL (ref 0.0–149.0)
VLDL: 10.4 mg/dL (ref 0.0–40.0)

## 2020-06-30 LAB — HEMOGLOBIN A1C: Hgb A1c MFr Bld: 5.4 % (ref 4.6–6.5)

## 2020-06-30 LAB — T4, FREE: Free T4: 0.72 ng/dL (ref 0.60–1.60)

## 2020-06-30 LAB — TSH: TSH: 2.53 u[IU]/mL (ref 0.35–4.50)

## 2020-06-30 LAB — T3, FREE: T3, Free: 3.1 pg/mL (ref 2.3–4.2)

## 2020-06-30 LAB — VITAMIN B12: Vitamin B-12: 234 pg/mL (ref 211–911)

## 2020-06-30 NOTE — Progress Notes (Signed)
   Subjective:    Patient ID: Rebecca Middleton, female    DOB: [birth year], 40 y.o.   MRN: [id number]  HPI Here for a well exam. Her only concern is rapid heart rates. She wears an Apple watch and she keeps track of this. At rest her HR is often 90-100 and when she exercises it can get as high as 210. No chest pain or SOB. We did an EKG last year which showed NSR and borderline widening of the QRS complexes. She sees her GYN next week.    Review of Systems  Constitutional: Negative.   HENT: Negative.    Eyes: Negative.   Respiratory: Negative.    Cardiovascular: Negative.   Gastrointestinal: Negative.   Genitourinary:  Negative for decreased urine volume, difficulty urinating, dyspareunia, dysuria, enuresis, flank pain, frequency, hematuria, pelvic pain and urgency.  Musculoskeletal: Negative.   Skin: Negative.   Neurological: Negative.  Negative for headaches.  Psychiatric/Behavioral: Negative.        Objective:   Physical Exam Constitutional:      General: She is not in acute distress.    Appearance: Normal appearance. She is well-developed.  HENT:     Head: Normocephalic and atraumatic.     Right Ear: External ear normal.     Left Ear: External ear normal.     Nose: Nose normal.     Mouth/Throat:     Pharynx: No oropharyngeal exudate.  Eyes:     General: No scleral icterus.    Conjunctiva/sclera: Conjunctivae normal.     Pupils: Pupils are equal, round, and reactive to light.  Neck:     Thyroid: No thyromegaly.     Vascular: No JVD.  Cardiovascular:     Rate and Rhythm: Normal rate and regular rhythm.     Heart sounds: Normal heart sounds. No murmur heard.   No friction rub. No gallop.  Pulmonary:     Effort: Pulmonary effort is normal. No respiratory distress.     Breath sounds: Normal breath sounds. No wheezing or rales.  Chest:     Chest wall: No tenderness.  Abdominal:     General: Bowel sounds are normal. There is no distension.     Palpations: Abdomen is soft.  There is no mass.     Tenderness: There is no abdominal tenderness. There is no guarding or rebound.  Musculoskeletal:        General: No tenderness. Normal range of motion.     Cervical back: Normal range of motion and neck supple.  Lymphadenopathy:     Cervical: No cervical adenopathy.  Skin:    General: Skin is warm and dry.     Findings: No erythema or rash.  Neurological:     Mental Status: She is alert and oriented to person, place, and time.     Cranial Nerves: No cranial nerve deficit.     Motor: No abnormal muscle tone.     Coordination: Coordination normal.     Deep Tendon Reflexes: Reflexes are normal and symmetric. Reflexes normal.  Psychiatric:        Behavior: Behavior normal.        Thought Content: Thought content normal.        Judgment: Judgment normal.          Assessment & Plan:  Well exam. We discussed diet and exercise. Get fasting labs. For the tachycardia. We will set up an ECHO soon.  Alysia Penna, MD

## 2020-08-04 ENCOUNTER — Ambulatory Visit (HOSPITAL_COMMUNITY)
Admission: RE | Admit: 2020-08-04 | Discharge: 2020-08-04 | Disposition: A | Discharge status: Home / Self Care | Payer: 59 | Source: Ambulatory Visit | Attending: Family Medicine | Admitting: Family Medicine

## 2020-08-04 DIAGNOSIS — R9431 Abnormal electrocardiogram [ECG] [EKG]: Secondary | ICD-10-CM | POA: Diagnosis not present

## 2020-08-04 DIAGNOSIS — R000 Tachycardia, unspecified: Secondary | ICD-10-CM | POA: Diagnosis not present

## 2020-08-04 LAB — ECHOCARDIOGRAM COMPLETE
Area-P 1/2: 4.63 cm2
S' Lateral: 3.1 cm

## 2020-10-18 ENCOUNTER — Telehealth (INDEPENDENT_AMBULATORY_CARE_PROVIDER_SITE_OTHER): Payer: 59 | Admitting: Family Medicine

## 2020-10-18 ENCOUNTER — Encounter: Payer: Self-pay | Admitting: Family Medicine

## 2020-10-18 DIAGNOSIS — J019 Acute sinusitis, unspecified: Secondary | ICD-10-CM

## 2020-10-18 MED ORDER — AZITHROMYCIN 250 MG PO TABS
ORAL_TABLET | ORAL | 0 refills | Status: DC
Start: 2020-10-18 — End: 2021-07-20

## 2020-10-18 NOTE — Progress Notes (Signed)
   Subjective:    Patient ID: Rebecca Middleton, female    DOB: 04-Dec-1980, 40 y.o.   MRN: [id number]  HPI Virtual Visit via Video Note  I connected with the patient on 10/18/20 at  3:45 PM EDT by a video enabled telemedicine application and verified that I am speaking with the correct person using two identifiers.  Location patient: home Location provider:work or home office Persons participating in the virtual visit: patient, provider  I discussed the limitations of evaluation and management by telemedicine and the availability of in person appointments. The patient expressed understanding and agreed to proceed.   HPI: Here for 2 days of sinus congestion, PND, ST, and a dry cough. No SOB or body aches. No fever or NVD. Drinking fluids.    ROS: See pertinent positives and negatives per HPI.  Past Medical History:  Diagnosis Date   Endometriosis    Headache(784.0)    Vitamin B12 deficiency     Past Surgical History:  Procedure Laterality Date   laproscopy     9-07 Dr Helane Rima   TONSILLECTOMY      Family History  Problem Relation Age of Onset   Alcohol abuse Other        family hx     Current Outpatient Medications:    azithromycin (ZITHROMAX Z-PAK) 250 MG tablet, As directed, Disp: 6 each, Rfl: 0  EXAM:  VITALS per patient if applicable:  GENERAL: alert, oriented, appears well and in no acute distress  HEENT: atraumatic, conjunttiva clear, no obvious abnormalities on inspection of external nose and ears  NECK: normal movements of the head and neck  LUNGS: on inspection no signs of respiratory distress, breathing rate appears normal, no obvious gross SOB, gasping or wheezing  CV: no obvious cyanosis  MS: moves all visible extremities without noticeable abnormality  PSYCH/NEURO: pleasant and cooperative, no obvious depression or anxiety, speech and thought processing grossly intact  ASSESSMENT AND PLAN: Sinusitis, treat with a Zpack. Add Mucinex BID. Alysia Penna, MD  Discussed the following assessment and plan:  No diagnosis found.     I discussed the assessment and treatment plan with the patient. The patient was provided an opportunity to ask questions and all were answered. The patient agreed with the plan and demonstrated an understanding of the instructions.   The patient was advised to call back or seek an in-person evaluation if the symptoms worsen or if the condition fails to improve as anticipated.      Review of Systems     Objective:   Physical Exam        Assessment & Plan:

## 2020-10-18 NOTE — Telephone Encounter (Signed)
I wrote the letter so she should be able to download it now

## 2021-07-06 ENCOUNTER — Encounter: Payer: 59 | Admitting: Family Medicine

## 2021-07-20 ENCOUNTER — Encounter: Payer: Self-pay | Admitting: Family Medicine

## 2021-07-20 ENCOUNTER — Ambulatory Visit (INDEPENDENT_AMBULATORY_CARE_PROVIDER_SITE_OTHER): Payer: 59 | Admitting: Family Medicine

## 2021-07-20 VITALS — BP 108/78 | HR 82 | Temp 98.5°F | Ht 63.75 in | Wt 144.0 lb

## 2021-07-20 DIAGNOSIS — J45990 Exercise induced bronchospasm: Secondary | ICD-10-CM | POA: Insufficient documentation

## 2021-07-20 DIAGNOSIS — D259 Leiomyoma of uterus, unspecified: Secondary | ICD-10-CM | POA: Insufficient documentation

## 2021-07-20 DIAGNOSIS — Z0000 Encounter for general adult medical examination without abnormal findings: Secondary | ICD-10-CM

## 2021-07-20 DIAGNOSIS — N809 Endometriosis, unspecified: Secondary | ICD-10-CM

## 2021-07-20 LAB — HEPATIC FUNCTION PANEL
ALT: 17 U/L (ref 0–35)
AST: 22 U/L (ref 0–37)
Albumin: 5 g/dL (ref 3.5–5.2)
Alkaline Phosphatase: 46 U/L (ref 39–117)
Bilirubin, Direct: 0.1 mg/dL (ref 0.0–0.3)
Total Bilirubin: 0.7 mg/dL (ref 0.2–1.2)
Total Protein: 8.5 g/dL — ABNORMAL HIGH (ref 6.0–8.3)

## 2021-07-20 LAB — BASIC METABOLIC PANEL
BUN: 13 mg/dL (ref 6–23)
CO2: 25 mEq/L (ref 19–32)
Calcium: 10.3 mg/dL (ref 8.4–10.5)
Chloride: 100 mEq/L (ref 96–112)
Creatinine, Ser: 0.82 mg/dL (ref 0.40–1.20)
GFR: 88.52 mL/min (ref >60.00)
Glucose, Bld: 86 mg/dL (ref 70–99)
Potassium: 4.5 mEq/L (ref 3.5–5.1)
Sodium: 136 mEq/L (ref 135–145)

## 2021-07-20 LAB — LIPID PANEL
Cholesterol: 184 mg/dL (ref 0–200)
HDL: 95.5 mg/dL (ref >39.00)
LDL Cholesterol: 80 mg/dL (ref 0–99)
NonHDL: 88.95
Total CHOL/HDL Ratio: 2
Triglycerides: 44 mg/dL (ref 0.0–149.0)
VLDL: 8.8 mg/dL (ref 0.0–40.0)

## 2021-07-20 LAB — CBC WITH DIFFERENTIAL/PLATELET
Basophils Absolute: 0.1 10*3/uL (ref 0.0–0.1)
Basophils Relative: 1 % (ref 0.0–3.0)
Eosinophils Absolute: 0.1 10*3/uL (ref 0.0–0.7)
Eosinophils Relative: 1.5 % (ref 0.0–5.0)
HCT: 40.2 % (ref 36.0–46.0)
Hemoglobin: 13.5 g/dL (ref 12.0–15.0)
Lymphocytes Relative: 30.7 % (ref 12.0–46.0)
Lymphs Abs: 1.8 10*3/uL (ref 0.7–4.0)
MCHC: 33.6 g/dL (ref 30.0–36.0)
MCV: 94.5 fl (ref 78.0–100.0)
Monocytes Absolute: 0.5 10*3/uL (ref 0.1–1.0)
Monocytes Relative: 8.4 % (ref 3.0–12.0)
Neutro Abs: 3.5 10*3/uL (ref 1.4–7.7)
Neutrophils Relative %: 58.4 % (ref 43.0–77.0)
Platelets: 292 10*3/uL (ref 150.0–400.0)
RBC: 4.26 Mil/uL (ref 3.87–5.11)
RDW: 12.5 % (ref 11.5–15.5)
WBC: 6 10*3/uL (ref 4.0–10.5)

## 2021-07-20 LAB — HEMOGLOBIN A1C: Hgb A1c MFr Bld: 5.4 % (ref 4.6–6.5)

## 2021-07-20 LAB — TSH: TSH: 2.04 u[IU]/mL (ref 0.35–5.50)

## 2021-07-20 MED ORDER — ALBUTEROL SULFATE HFA 108 (90 BASE) MCG/ACT IN AERS: 2.0000 | INHALATION_SPRAY | RESPIRATORY_TRACT | 5 refills | Status: DC | PRN

## 2021-07-20 NOTE — Progress Notes (Signed)
Subjective:    Patient ID: Rebecca Middleton, female    DOB: 08-21-1980, 41 y.o.   MRN: [id number]  HPI Here for a well exam. She feels well except for some exercise induced asthma. This started bothering her about 6 months ago. When she works up she often gets mildly SOB and she coughs. Otherwise she is seeing her GYN, Dr. Helane Rima, for uterine fibroids that cause painful heavy menses. She has started on a progestin only BCP, and this seems to be helping.    Review of Systems  Constitutional: Negative.   HENT: Negative.    Eyes: Negative.   Respiratory: Negative.    Cardiovascular: Negative.   Gastrointestinal: Negative.   Genitourinary:  Positive for menstrual problem. Negative for decreased urine volume, difficulty urinating, dyspareunia, dysuria, enuresis, flank pain, frequency, hematuria, pelvic pain and urgency.  Musculoskeletal: Negative.   Skin: Negative.   Neurological: Negative.  Negative for headaches.  Psychiatric/Behavioral: Negative.         Objective:   Physical Exam Constitutional:      General: She is not in acute distress.    Appearance: Normal appearance. She is well-developed.  HENT:     Head: Normocephalic and atraumatic.     Right Ear: External ear normal.     Left Ear: External ear normal.     Nose: Nose normal.     Mouth/Throat:     Pharynx: No oropharyngeal exudate.  Eyes:     General: No scleral icterus.    Conjunctiva/sclera: Conjunctivae normal.     Pupils: Pupils are equal, round, and reactive to light.  Neck:     Thyroid: No thyromegaly.     Vascular: No JVD.  Cardiovascular:     Rate and Rhythm: Normal rate and regular rhythm.     Heart sounds: Normal heart sounds. No murmur heard.    No friction rub. No gallop.  Pulmonary:     Effort: Pulmonary effort is normal. No respiratory distress.     Breath sounds: Normal breath sounds. No wheezing or rales.  Chest:     Chest wall: No tenderness.  Abdominal:     General: Bowel sounds are normal.  There is no distension.     Palpations: Abdomen is soft. There is no mass.     Tenderness: There is no abdominal tenderness. There is no guarding or rebound.  Musculoskeletal:        General: No tenderness. Normal range of motion.     Cervical back: Normal range of motion and neck supple.  Lymphadenopathy:     Cervical: No cervical adenopathy.  Skin:    General: Skin is warm and dry.     Findings: No erythema or rash.  Neurological:     Mental Status: She is alert and oriented to person, place, and time.     Cranial Nerves: No cranial nerve deficit.     Motor: No abnormal muscle tone.     Coordination: Coordination normal.     Deep Tendon Reflexes: Reflexes are normal and symmetric. Reflexes normal.  Psychiatric:        Behavior: Behavior normal.        Thought Content: Thought content normal.        Judgment: Judgment normal.           Assessment & Plan:  Well exam. We discussed diet and exercise. Get fasting labs. For the exercise induced asthma, she can use Albuterol sulfate HFA as needed.  Alysia Penna, MD

## 2021-11-26 ENCOUNTER — Telehealth: Payer: 59 | Admitting: Emergency Medicine

## 2021-11-26 DIAGNOSIS — R1013 Epigastric pain: Secondary | ICD-10-CM | POA: Diagnosis not present

## 2021-11-26 NOTE — Patient Instructions (Signed)
  Reginold Agent, thank you for joining Carvel Getting, NP for today's virtual visit.  While this provider is not your primary care provider (PCP), if your PCP is located in our provider database this encounter information will be shared with them immediately following your visit.   [facility] account gives you access to today's visit and all your visits, tests, and labs performed at [facility] " click here if you don't have a [facility] account or go to [email or web address]  Consent: (Patient) Rebecca Middleton provided verbal consent for this virtual visit at the beginning of the encounter.  Current Medications:  Current Outpatient Medications:    albuterol (VENTOLIN HFA) 108 (90 Base) MCG/ACT inhaler, Inhale 2 puffs into the lungs every 4 (four) hours as needed for wheezing or shortness of breath., Disp: 18 g, Rfl: 5   Drospirenone (SLYND) 4 MG TABS, Take 4 mg by mouth daily., Disp: , Rfl:    Medications ordered in this encounter:  No orders of the defined types were placed in this encounter.    *If you need refills on other medications prior to your next appointment, please contact your pharmacy*  Follow-Up: Call back or seek an in-person evaluation if the symptoms worsen or if the condition fails to improve as anticipated.  [facility] [phone]  Other Instructions Today eat a low-fat, bland diet.  You can use Tums or Pepto-Bismol on a schedule to see if that helps keep the pain in control.  Do not take more Aleve than is recommended on the package.  If your pain becomes severe again, please seek care in an emergency room.  Otherwise, contact your primary care physician Dr. Sarajane Jews in the morning to arrange for evaluation of your abdominal pain.   If you have been instructed to have an in-person evaluation today at a local Urgent Care facility, please use the link below. It will take you to a list of all of our available [facility], including address, phone number and hours of operation. Please do not delay care.  [facility]  If you or a family member do not have a primary care provider, use the link below to schedule a visit and establish care. When you choose a [facility] primary care physician or advanced practice provider, you gain a long-term partner in health. Find a Primary Care Provider  Learn more about 's in-office and virtual care options: [facility]

## 2021-11-26 NOTE — Progress Notes (Signed)
Virtual Visit Consent   Rebecca Middleton, you are scheduled for a virtual visit with a [facility] provider today. Just as with appointments in the office, your consent must be obtained to participate. Your consent will be active for this visit and any virtual visit you may have with one of our providers in the next 365 days. If you have a MyChart account, a copy of this consent can be sent to you electronically.  As this is a virtual visit, video technology does not allow for your provider to perform a traditional examination. This may limit your provider's ability to fully assess your condition. If your provider identifies any concerns that need to be evaluated in person or the need to arrange testing (such as labs, EKG, etc.), we will make arrangements to do so. Although advances in technology are sophisticated, we cannot ensure that it will always work on either your end or our end. If the connection with a video visit is poor, the visit may have to be switched to a telephone visit. With either a video or telephone visit, we are not always able to ensure that we have a secure connection.  By engaging in this virtual visit, you consent to the provision of healthcare and authorize for your insurance to be billed (if applicable) for the services provided during this visit. Depending on your insurance coverage, you may receive a charge related to this service.  I need to obtain your verbal consent now. Are you willing to proceed with your visit today? Rebecca Middleton has provided verbal consent on 11/26/2021 for a virtual visit (video or telephone). Carvel Getting, NP  Date: 11/26/2021 11:04 AM  Virtual Visit via Video Note   I, Carvel Getting, connected with  Rebecca Middleton  ([id number], 28-Apr-1980) on 11/26/21 at 10:45 AM EST by a video-enabled telemedicine application and verified that I am speaking with the correct person using two identifiers.  Location: Patient: Virtual Visit Location Patient:  Other: parked car in [place] Provider: Virtual Visit Location Provider: Home Office   I discussed the limitations of evaluation and management by telemedicine and the availability of in person appointments. The patient expressed understanding and agreed to proceed.    History of Present Illness: Rebecca Middleton is a 41 y.o. who identifies as a female who was assigned female at birth, and is being seen today for abdominal pain.  Pain woke her up from sleep on 11/19/2021.  Pain is in the epigastric area and does not move.  She describes it as a pulsing pain.  It comes and goes in waves.  Sometimes the pain is more severe sometimes the pain is better.  She tried taking Tums when it initially happened and it did not help.  She then tried eating several days of small bland meals and this did help until midday yesterday, 11/25/2021.  Yesterday after eating [place] chicken and sweet potato fries, the pain returned and was more severe.  She took Pepto-Bismol which did not help and she tried 2 doses of Aleve 2 hours apart yesterday evening, which also did not relieve her pain.  This morning she has eaten nothing but had severe pain for a while that has since eased off and now the pain is tolerable.  The pain has remained in the epigastric area for this entire week.  She does drink alcohol, typically 1 to 2 glasses of wine a day.  She did have 6 ounces of white wine last night.  She has a history of prior surgery on her abdomen; it was laparoscopic surgery to treat a ruptured ovarian cyst.  She does have a history of endometriosis.  She was nauseated several days ago for a brief time but never vomited.  She has not been nauseated since.  Her bowel movements are normal for her and she does not feel bloated or like she has increased gas.  No fever.  HPI: HPI  Problems:  Patient Active Problem List   Diagnosis Date Noted   Exercise-induced asthma 07/20/2021   Uterine leiomyoma 07/20/2021   Endometriosis 07/20/2021    ALLERGIC RHINITIS 10/06/2007   VITAMIN B12 DEFICIENCY 03/12/2007    Allergies: No Known Allergies Medications:  Current Outpatient Medications:    albuterol (VENTOLIN HFA) 108 (90 Base) MCG/ACT inhaler, Inhale 2 puffs into the lungs every 4 (four) hours as needed for wheezing or shortness of breath., Disp: 18 g, Rfl: 5   Drospirenone (SLYND) 4 MG TABS, Take 4 mg by mouth daily., Disp: , Rfl:   Observations/Objective: Patient is well-developed, well-nourished in no acute distress.  Resting comfortably in her car parked in [place]  Head is normocephalic, atraumatic.  No labored breathing.  Speech is clear and coherent with logical content.  Patient is alert and oriented at baseline.    Assessment and Plan: 1. Epigastric abdominal pain  Discussed possibilities with patient.  Likely problems could include peptic ulcer disease, pancreatitis, cholelithiasis or cholecystitis.  Patient is to eat low-fat, bland food for the rest of the day.  She can continue to try to use Tums or Pepto-Bismol if she desires on a schedule.  She will contact her PCP in the morning to arrange follow-up care which may include imaging or GI referral.  If her pain becomes more severe again today, she will seek care in an emergency room.  Follow Up Instructions: I discussed the assessment and treatment plan with the patient. The patient was provided an opportunity to ask questions and all were answered. The patient agreed with the plan and demonstrated an understanding of the instructions.  A copy of instructions were sent to the patient via MyChart unless otherwise noted below.   The patient was advised to call back or seek an in-person evaluation if the symptoms worsen or if the condition fails to improve as anticipated.  Time:  I spent 16 minutes with the patient via telehealth technology discussing the above problems/concerns.    Carvel Getting, NP

## 2022-08-02 ENCOUNTER — Encounter: Payer: Self-pay | Admitting: Family Medicine

## 2022-08-02 ENCOUNTER — Ambulatory Visit (INDEPENDENT_AMBULATORY_CARE_PROVIDER_SITE_OTHER): Payer: 59 | Admitting: Family Medicine

## 2022-08-02 VITALS — BP 100/72 | HR 80 | Temp 98.1°F | Ht 64.0 in | Wt 147.0 lb

## 2022-08-02 DIAGNOSIS — Z0000 Encounter for general adult medical examination without abnormal findings: Secondary | ICD-10-CM | POA: Diagnosis not present

## 2022-08-02 DIAGNOSIS — I7300 Raynaud's syndrome without gangrene: Secondary | ICD-10-CM | POA: Insufficient documentation

## 2022-08-02 LAB — HEPATIC FUNCTION PANEL
ALT: 14 U/L (ref 0–35)
AST: 18 U/L (ref 0–37)
Albumin: 4.7 g/dL (ref 3.5–5.2)
Alkaline Phosphatase: 46 U/L (ref 39–117)
Bilirubin, Direct: 0.2 mg/dL (ref 0.0–0.3)
Total Bilirubin: 0.8 mg/dL (ref 0.2–1.2)
Total Protein: 8.2 g/dL (ref 6.0–8.3)

## 2022-08-02 LAB — CBC WITH DIFFERENTIAL/PLATELET
Basophils Absolute: 0.1 10*3/uL (ref 0.0–0.1)
Basophils Relative: 0.7 % (ref 0.0–3.0)
Eosinophils Absolute: 0.1 10*3/uL (ref 0.0–0.7)
Eosinophils Relative: 1.1 % (ref 0.0–5.0)
HCT: 39.5 % (ref 36.0–46.0)
Hemoglobin: 13 g/dL (ref 12.0–15.0)
Lymphocytes Relative: 25.6 % (ref 12.0–46.0)
Lymphs Abs: 1.8 10*3/uL (ref 0.7–4.0)
MCHC: 33 g/dL (ref 30.0–36.0)
MCV: 95.4 fl (ref 78.0–100.0)
Monocytes Absolute: 0.6 10*3/uL (ref 0.1–1.0)
Monocytes Relative: 7.9 % (ref 3.0–12.0)
Neutro Abs: 4.6 10*3/uL (ref 1.4–7.7)
Neutrophils Relative %: 64.7 % (ref 43.0–77.0)
Platelets: 338 10*3/uL (ref 150.0–400.0)
RBC: 4.13 Mil/uL (ref 3.87–5.11)
RDW: 12.5 % (ref 11.5–15.5)
WBC: 7.1 10*3/uL (ref 4.0–10.5)

## 2022-08-02 LAB — BASIC METABOLIC PANEL
BUN: 13 mg/dL (ref 6–23)
CO2: 27 mEq/L (ref 19–32)
Calcium: 10.2 mg/dL (ref 8.4–10.5)
Chloride: 101 mEq/L (ref 96–112)
Creatinine, Ser: 0.76 mg/dL (ref 0.40–1.20)
GFR: 96.27 mL/min (ref >60.00)
Glucose, Bld: 93 mg/dL (ref 70–99)
Potassium: 4.5 mEq/L (ref 3.5–5.1)
Sodium: 136 mEq/L (ref 135–145)

## 2022-08-02 LAB — LIPID PANEL
Cholesterol: 171 mg/dL (ref 0–200)
HDL: 87.1 mg/dL (ref >39.00)
LDL Cholesterol: 73 mg/dL (ref 0–99)
NonHDL: 84.21
Total CHOL/HDL Ratio: 2
Triglycerides: 57 mg/dL (ref 0.0–149.0)
VLDL: 11.4 mg/dL (ref 0.0–40.0)

## 2022-08-02 LAB — HEMOGLOBIN A1C: Hgb A1c MFr Bld: 5.2 % (ref 4.6–6.5)

## 2022-08-02 LAB — TSH: TSH: 2.07 u[IU]/mL (ref 0.35–5.50)

## 2022-08-02 MED ORDER — ALBUTEROL SULFATE HFA 108 (90 BASE) MCG/ACT IN AERS: 2.0000 | INHALATION_SPRAY | RESPIRATORY_TRACT | 5 refills | Status: AC | PRN

## 2022-08-02 NOTE — Progress Notes (Signed)
Subjective:    Patient ID: Rebecca Middleton, female    DOB: Dec 05, 1980, 42 y.o.   MRN: [id number]  HPI Here for a well exam. She had been doing well until her menses became very heavy and painful. She sees Dr. Marcelle Overlie for GYN care, and she was found to have numerous fairly large fibroid tumors. She has recommended a hysterectomy, and since Eleanna also has endometriosis, they plan to remove the ovaries as well. Her asthma has been stable. She also mentions that since last winter, her hands will sometimes turn white. This is not painful. This is often triggered but exposure to cold but not always.    Review of Systems  Constitutional: Negative.   HENT: Negative.    Eyes: Negative.   Respiratory: Negative.    Cardiovascular: Negative.   Gastrointestinal: Negative.   Genitourinary:  Negative for decreased urine volume, difficulty urinating, dyspareunia, dysuria, enuresis, flank pain, frequency, hematuria, pelvic pain and urgency.  Musculoskeletal: Negative.   Skin:  Positive for color change.  Neurological: Negative.  Negative for headaches.  Psychiatric/Behavioral: Negative.         Objective:   Physical Exam Constitutional:      General: She is not in acute distress.    Appearance: Normal appearance. She is well-developed.  HENT:     Head: Normocephalic and atraumatic.     Right Ear: External ear normal.     Left Ear: External ear normal.     Nose: Nose normal.     Mouth/Throat:     Pharynx: No oropharyngeal exudate.  Eyes:     General: No scleral icterus.    Conjunctiva/sclera: Conjunctivae normal.     Pupils: Pupils are equal, round, and reactive to light.  Neck:     Thyroid: No thyromegaly.     Vascular: No JVD.  Cardiovascular:     Rate and Rhythm: Normal rate and regular rhythm.     Pulses: Normal pulses.     Heart sounds: Normal heart sounds. No murmur heard.    No friction rub. No gallop.  Pulmonary:     Effort: Pulmonary effort is normal. No respiratory  distress.     Breath sounds: Normal breath sounds. No wheezing or rales.  Chest:     Chest wall: No tenderness.  Abdominal:     General: Bowel sounds are normal. There is no distension.     Palpations: Abdomen is soft. There is no mass.     Tenderness: There is no abdominal tenderness. There is no guarding or rebound.  Musculoskeletal:        General: No tenderness. Normal range of motion.     Cervical back: Normal range of motion and neck supple.  Lymphadenopathy:     Cervical: No cervical adenopathy.  Skin:    General: Skin is warm and dry.     Findings: No erythema or rash.     Comments: Her hands are warm and pink  Neurological:     Mental Status: She is alert and oriented to person, place, and time.     Cranial Nerves: No cranial nerve deficit.     Motor: No abnormal muscle tone.     Coordination: Coordination normal.     Deep Tendon Reflexes: Reflexes are normal and symmetric. Reflexes normal.  Psychiatric:        Behavior: Behavior normal.        Thought Content: Thought content normal.        Judgment: Judgment normal.  Assessment & Plan:  Well exam. We discussed diet and exercise. Get fasting labs. She will follow up with Dr. Vincente Poli about the TAH and BSO. She also has Reynaud's syndrome. I advised her to keep her hands warm as much as possible. If this becomes painful. She knows that there are medications that we could use for that.  Gershon Crane, MD

## 2023-04-04 ENCOUNTER — Ambulatory Visit: Admitting: Adult Health

## 2023-04-04 VITALS — BP 100/60 | HR 73 | Temp 97.8°F | Ht 64.0 in | Wt 148.0 lb

## 2023-04-04 DIAGNOSIS — J0141 Acute recurrent pansinusitis: Secondary | ICD-10-CM | POA: Diagnosis not present

## 2023-04-04 MED ORDER — DOXYCYCLINE HYCLATE 100 MG PO CAPS
100.0000 mg | ORAL_CAPSULE | Freq: Two times a day (BID) | ORAL | 0 refills | Status: AC
Start: 2023-04-04 — End: ?

## 2023-04-04 NOTE — Progress Notes (Signed)
 Subjective:    Patient ID: Rebecca Middleton, female    DOB: 31-May-1980, 43 y.o.   MRN: [id number]  Cough Associated symptoms include headaches.  Headache  Associated symptoms include coughing.    43 year old female who  has a past medical history of Endometriosis, Headache(784.0), and Vitamin B12 deficiency.  She presents to the office today for an acute issue  She reports that for a month she has been experiencing URI like symptoms. Her symptoms include that of ear feeling clogged, loss of taster and smell, nasal congestion with green mucus, sinus pain and pressure.  She had a fever at the beginning of her illness. Her symptoms seem to be waxing and waning.   Review of Systems  Respiratory:  Positive for cough.   Neurological:  Positive for headaches.   See HPI  Past Medical History:  Diagnosis Date   Endometriosis    Headache(784.0)    Vitamin B12 deficiency     Social History   Socioeconomic History   Marital status: Married    Spouse name: Not on file   Number of children: Not on file   Years of education: Not on file   Highest education level: Not on file  Occupational History   Not on file  Tobacco Use   Smoking status: Never   Smokeless tobacco: Never  Substance and Sexual Activity   Alcohol use: Yes    Alcohol/week: 6.0 standard drinks of alcohol    Types: 6 Glasses of wine per week   Drug use: No   Sexual activity: Not on file  Other Topics Concern   Not on file  Social History Narrative   Not on file   Social Drivers of Health   Financial Resource Strain: Not on file  Food Insecurity: Not on file  Transportation Needs: Not on file  Physical Activity: Not on file  Stress: Not on file  Social Connections: Unknown (05/22/2021)   [facility] from [facility]   Social Network    Social Network: Not on file  Intimate Partner Violence: Unknown (04/13/2021)   [facility] from [facility], [facility]   HITS    Physically Hurt: Not on file     Insult or Talk Down To: Not on file    Threaten Physical Harm: Not on file    Scream or Curse: Not on file    Past Surgical History:  Procedure Laterality Date   laproscopy     9-07 Dr Vincente Poli   TONSILLECTOMY      Family History  Problem Relation Age of Onset   Alcohol abuse Other        family hx    No Known Allergies  Current Outpatient Medications on File Prior to Visit  Medication Sig Dispense Refill   albuterol (VENTOLIN HFA) 108 (90 Base) MCG/ACT inhaler Inhale 2 puffs into the lungs every 4 (four) hours as needed for wheezing or shortness of breath. 18 g 5   Drospirenone (SLYND) 4 MG TABS Take 4 mg by mouth daily.     No current facility-administered medications on file prior to visit.    BP 100/60   Pulse 73   Temp 97.8 F (36.6 C) (Oral)   Ht 5\' 4"  (1.626 m)   Wt 148 lb (67.1 kg)   LMP 07/16/2022 (Exact Date)   SpO2 98%   BMI 25.40 kg/m       Objective:   Physical Exam Vitals and nursing note reviewed.  Constitutional:  Appearance: Normal appearance.  HENT:     Right Ear: A middle ear effusion is present. Tympanic membrane is not erythematous.     Left Ear: A middle ear effusion is present. Tympanic membrane is not erythematous.     Nose: Congestion present. No rhinorrhea.     Right Turbinates: Enlarged and swollen.     Left Turbinates: Enlarged and swollen.  Cardiovascular:     Rate and Rhythm: Normal rate and regular rhythm.     Pulses: Normal pulses.     Heart sounds: Normal heart sounds.  Pulmonary:     Effort: Pulmonary effort is normal.     Breath sounds: Normal breath sounds.  Musculoskeletal:        General: Normal range of motion.  Skin:    General: Skin is warm and dry.  Neurological:     General: No focal deficit present.     Mental Status: She is alert and oriented to person, place, and time.  Psychiatric:        Mood and Affect: Mood normal.        Behavior: Behavior normal.        Thought Content: Thought content  normal.        Judgment: Judgment normal.       Assessment & Plan:  1. Acute recurrent pansinusitis (Primary) - Will treat due to symptoms and duration.  - Follow up if not resolved by the end of antibiotic treatment  - doxycycline (VIBRAMYCIN) 100 MG capsule; Take 1 capsule (100 mg total) by mouth 2 (two) times daily.  Dispense: 14 capsule; Refill: 0  Shirline Frees, NP  Time spent with patient today was 32 minutes which consisted of chart review, discussing sinusitis, work up, treatment answering questions and documentation.

## 2023-04-05 ENCOUNTER — Telehealth: Payer: Self-pay | Admitting: Family Medicine

## 2023-04-05 NOTE — Telephone Encounter (Signed)
 Copied from CRM [id number]. Topic: Clinical - Medication Question >> Apr 05, 2023  9:35 AM Gurney Maxin H wrote: Reason for CRM: Patient was in the office 3/27 to see [facility] and he recommended for her to have a prescription for Prednisone and patient declined and now wondering if Kandee Keen can go ahead and send over a prescription for the medication that he recommended, patient states if office needs to reach out leave a message or text she will be at work and can't answer her phone, thanks.  [place] [phone]

## 2023-04-05 NOTE — Telephone Encounter (Signed)
**Note De-identified  [place] Obfuscation** Please advise 

## 2023-04-08 NOTE — Telephone Encounter (Signed)
 Left a message for the patient to return my call.

## 2023-04-08 NOTE — Telephone Encounter (Signed)
 Does she still need the Prednisone?

## 2023-04-10 NOTE — Telephone Encounter (Signed)
 Spoke with pt states that she is feeling better and does not need the prednisone prescription.

## 2023-10-09 ENCOUNTER — Encounter: Admitting: Family Medicine
